# Patient Record
Sex: Male | Born: 1940 | Race: White | Hispanic: No | Marital: Married | Smoking: Former smoker
Health system: Southern US, Community
[De-identification: ages and names within clinical notes are randomized; demographics above are authoritative.]

## PROBLEM LIST (undated history)

## (undated) DIAGNOSIS — Z9189 Other specified personal risk factors, not elsewhere classified: Secondary | ICD-10-CM

## (undated) DIAGNOSIS — Z972 Presence of dental prosthetic device (complete) (partial): Secondary | ICD-10-CM

## (undated) DIAGNOSIS — Z951 Presence of aortocoronary bypass graft: Secondary | ICD-10-CM

## (undated) DIAGNOSIS — I10 Essential (primary) hypertension: Secondary | ICD-10-CM

## (undated) DIAGNOSIS — E785 Hyperlipidemia, unspecified: Secondary | ICD-10-CM

## (undated) DIAGNOSIS — I252 Old myocardial infarction: Secondary | ICD-10-CM

## (undated) DIAGNOSIS — Z973 Presence of spectacles and contact lenses: Secondary | ICD-10-CM

## (undated) DIAGNOSIS — K219 Gastro-esophageal reflux disease without esophagitis: Secondary | ICD-10-CM

## (undated) DIAGNOSIS — R351 Nocturia: Secondary | ICD-10-CM

## (undated) DIAGNOSIS — E119 Type 2 diabetes mellitus without complications: Secondary | ICD-10-CM

## (undated) DIAGNOSIS — J302 Other seasonal allergic rhinitis: Secondary | ICD-10-CM

## (undated) DIAGNOSIS — G5793 Unspecified mononeuropathy of bilateral lower limbs: Secondary | ICD-10-CM

## (undated) DIAGNOSIS — I2510 Atherosclerotic heart disease of native coronary artery without angina pectoris: Secondary | ICD-10-CM

## (undated) DIAGNOSIS — M1990 Unspecified osteoarthritis, unspecified site: Secondary | ICD-10-CM

## (undated) DIAGNOSIS — C61 Malignant neoplasm of prostate: Secondary | ICD-10-CM

## (undated) HISTORY — PX: TONSILLECTOMY: SUR1361

## (undated) HISTORY — DX: Malignant neoplasm of prostate: C61

## (undated) HISTORY — PX: CARDIAC CATHETERIZATION: SHX172

## (undated) HISTORY — PX: CORONARY ARTERY BYPASS GRAFT: SHX141

---

## 2002-10-05 ENCOUNTER — Encounter: Payer: Self-pay | Admitting: Cardiothoracic Surgery

## 2002-10-09 ENCOUNTER — Encounter: Payer: Self-pay | Admitting: Cardiothoracic Surgery

## 2002-10-09 ENCOUNTER — Inpatient Hospital Stay (HOSPITAL_COMMUNITY): Admission: RE | Admit: 2002-10-09 | Discharge: 2002-10-14 | Payer: Self-pay | Admitting: Cardiothoracic Surgery

## 2002-10-10 ENCOUNTER — Encounter: Payer: Self-pay | Admitting: Cardiothoracic Surgery

## 2002-10-11 ENCOUNTER — Encounter: Payer: Self-pay | Admitting: Cardiothoracic Surgery

## 2002-10-12 ENCOUNTER — Encounter: Payer: Self-pay | Admitting: Cardiothoracic Surgery

## 2002-11-02 ENCOUNTER — Encounter: Payer: Self-pay | Admitting: Cardiothoracic Surgery

## 2002-11-02 ENCOUNTER — Encounter: Admission: RE | Admit: 2002-11-02 | Discharge: 2002-11-02 | Payer: Self-pay | Admitting: Cardiothoracic Surgery

## 2005-12-01 ENCOUNTER — Ambulatory Visit (HOSPITAL_COMMUNITY): Admission: RE | Admit: 2005-12-01 | Discharge: 2005-12-01 | Payer: Self-pay | Admitting: Orthopedic Surgery

## 2006-07-11 ENCOUNTER — Ambulatory Visit: Payer: Self-pay | Admitting: Gastroenterology

## 2010-07-20 ENCOUNTER — Ambulatory Visit: Payer: Self-pay | Admitting: Gastroenterology

## 2010-07-21 LAB — PATHOLOGY REPORT

## 2011-07-28 ENCOUNTER — Emergency Department: Payer: Self-pay

## 2014-03-25 DIAGNOSIS — C61 Malignant neoplasm of prostate: Secondary | ICD-10-CM

## 2014-03-25 HISTORY — DX: Malignant neoplasm of prostate: C61

## 2014-03-29 HISTORY — PX: PROSTATE BIOPSY: SHX241

## 2014-04-24 ENCOUNTER — Encounter: Payer: Self-pay | Admitting: *Deleted

## 2014-05-02 ENCOUNTER — Encounter: Payer: Self-pay | Admitting: Radiation Oncology

## 2014-05-02 ENCOUNTER — Ambulatory Visit
Admission: RE | Admit: 2014-05-02 | Discharge: 2014-05-02 | Disposition: A | Discharge status: Home / Self Care | Payer: PRIVATE HEALTH INSURANCE | Source: Ambulatory Visit | Attending: Radiation Oncology | Admitting: Radiation Oncology

## 2014-05-02 VITALS — BP 130/59 | HR 74 | Temp 97.8°F | Resp 20 | Ht 70.0 in | Wt 244.0 lb

## 2014-05-02 DIAGNOSIS — I252 Old myocardial infarction: Secondary | ICD-10-CM | POA: Insufficient documentation

## 2014-05-02 DIAGNOSIS — E7800 Pure hypercholesterolemia, unspecified: Secondary | ICD-10-CM | POA: Insufficient documentation

## 2014-05-02 DIAGNOSIS — C61 Malignant neoplasm of prostate: Secondary | ICD-10-CM | POA: Diagnosis not present

## 2014-05-02 DIAGNOSIS — Z87891 Personal history of nicotine dependence: Secondary | ICD-10-CM | POA: Diagnosis not present

## 2014-05-02 DIAGNOSIS — Z510 Encounter for antineoplastic radiation therapy: Secondary | ICD-10-CM | POA: Diagnosis not present

## 2014-05-02 DIAGNOSIS — E119 Type 2 diabetes mellitus without complications: Secondary | ICD-10-CM | POA: Insufficient documentation

## 2014-05-02 DIAGNOSIS — Z951 Presence of aortocoronary bypass graft: Secondary | ICD-10-CM | POA: Insufficient documentation

## 2014-05-02 DIAGNOSIS — N529 Male erectile dysfunction, unspecified: Secondary | ICD-10-CM | POA: Diagnosis not present

## 2014-05-02 DIAGNOSIS — I10 Essential (primary) hypertension: Secondary | ICD-10-CM | POA: Diagnosis not present

## 2014-05-02 DIAGNOSIS — Z7982 Long term (current) use of aspirin: Secondary | ICD-10-CM | POA: Diagnosis not present

## 2014-05-02 HISTORY — DX: Essential (primary) hypertension: I10

## 2014-05-02 NOTE — Progress Notes (Signed)
[facility] NEW PATIENT EVALUATION  Name: Melvin Johnson MRN: [id number]  Date:   05/02/2014           DOB: Apr 04, 1941  Status: outpatient   CC: Dr. Emily Filbert,  Risa Grill Camelia Eng, MD    REFERRING PHYSICIAN: Bernestine Amass, MD   DIAGNOSIS: Stage TI C. favorable risk adenocarcinoma prostate   HISTORY OF PRESENT ILLNESS:  Melvin Johnson is a 73 y.o. male who is seen today through the courtesy of Dr. Rana Snare for discussion of possible radiation therapy in the management of his stage TI C. favorable risk adenocarcinoma prostate. His PSA was under 1.0 until 2007. Since then he has had a steady rise. In June of 2014 his PSA was 3.1, rising to 4.3 by January of 2015. His last PSA was 5.2 on 03/04/2014. Percentage free PSA was only 16. He underwent ultrasound-guided biopsies by Dr. Risa Grill on 03/29/2014. He was found to have Gleason 6 (3+3) involving 5% of one core from right lateral mid gland, 20% of one core from the right lateral apex, 20% of one core from the right apex, 20% of one core from the left lateral base, 40% of one core from the left lateral mid gland and 25% of one core from left lateral apex. His gland volume was 34 cc. He is doing well from a GU and GI standpoint. His I PSS score is 1. He does have erectile dysfunction. He is interested in seed implantation.  PREVIOUS RADIATION THERAPY: No   PAST MEDICAL HISTORY:  has a past medical history of Diabetes mellitus without complication; Hypercholesterolemia; Hypertension; Prostate cancer (03/2014); Allergy; and Myocardial infarction (1999).     PAST SURGICAL HISTORY:  Past Surgical History  Procedure Laterality Date  . Cardiac surgery  2003    BYPASS x 4  . Prostate biopsy  03/29/14    Adenocarcinoma     FAMILY HISTORY: family history includes Cancer in his brother and mother; Heart attack in his father. His father died of heart attack at age 3. His mother died from lung  cancer.   SOCIAL HISTORY:  reports that he quit smoking about 16 years ago. His smoking use included Cigarettes. He has a 10 pack-year smoking history. He does not have any smokeless tobacco history on file. He reports that he does not drink alcohol or use illicit drugs.  Married, 3 children. 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   ALLERGIES: Iodine   MEDICATIONS:  Current Outpatient Prescriptions  Medication Sig Dispense Refill  . aspirin 81 MG tablet Take 81 mg by mouth daily.      Marland Kitchen atenolol (TENORMIN) 50 MG tablet Take 50 mg by mouth daily.      Marland Kitchen glipiZIDE (GLUCOTROL) 5 MG tablet Take 5 mg by mouth.      . hydrochlorothiazide (HYDRODIURIL) 25 MG tablet Take 25 mg by mouth daily.      Marland Kitchen lisinopril (PRINIVIL,ZESTRIL) 20 MG tablet Take 20 mg by mouth daily.      . Multiple Vitamin (MULTIVITAMIN) tablet Take 1 tablet by mouth daily.      . sildenafil (REVATIO) 20 MG tablet Take 20 mg by mouth as needed.      . simvastatin (ZOCOR) 40 MG tablet Take 40 mg by mouth at bedtime.       No current facility-administered medications for this encounter.     REVIEW OF  SYSTEMS:  Pertinent items are noted in HPI.    PHYSICAL EXAM:  height is 5\' 10"  (1.778 m) and weight is 244 lb (110.678 kg). His oral temperature is 97.8 F (36.6 C). His blood pressure is 130/59 and his pulse is 74. His respiration is 20.   Alert and oriented 73 year old white male appearing his stated age. Head and neck examination: Grossly unremarkable. Nodes: Without palpable cervical or supraclavicular adenopathy. Chest: Lungs clear. Abdomen: Without masses organomegaly. Genitalia: Unremarkable to inspection. Rectal: Prostate gland is normal size and is without focal induration or nodularity. Extremities: Without edema.   LABORATORY DATA:  No results found for this basename: WBC, HGB, HCT, MCV, PLT   No results found for this basename: NA, K, CL, CO2    No results found for this basename: ALT, AST, GGT, ALKPHOS, BILITOT   PSA 5.2 from 03/04/2014   IMPRESSION: Stage TI C. favorable risk adenocarcinoma prostate. I explained to the patient and his wife that his prognosis is related to his stage, PSA level, and Gleason score. All are favorable. Other prognostic factors include PSA doubling time and disease volume, and these are also favorable. We discussed close observation/surveillance and also radiation therapy options including seed implantation alone or 8 weeks of external beam/IMRT. He would be a good candidate for this observation/surveillance or prostate seed implantation. We discussed the fact that he is probably not likely to died from prostate cancer. Since he does appear to have a 10 year life expectancy, I would offer him potentially curative ration therapy with seed implantation. We discussed the potential acute and late toxicities of radiation therapy. We discussed radiation safety concerns as well. If he wants seed implantation then I would like to obtain a CT arch study. He was given literature for review. He'll think things over and get back in touch with me if he wants to consider seed implantation. All of his questions were answered.   PLAN: As discussed above.  I spent 60 minutes minutes face to face with the patient and more than 50% of that time was spent in counseling and/or coordination of care.

## 2014-05-02 NOTE — Progress Notes (Signed)
GU Location of Tumor / Histology: PROSTATE  If Prostate Cancer, Gleason Score is ( 3 +3=6 ) and PSA  ( 5.20) 03/04/14 VOLUME= 34CC 03/29/14     Eudelia Bunch presented  months ago with signs/symptoms of: increase in PSA'S,  (6/12) cores January 2015 PSA=-4.3 Aoril 2015=4.95 June 2014 PSA=3.1 2007 PSA=1.0  Biopsies of  Prostate  revealed:03/29/14 : Adenocarcinoma   Past/Anticipated interventions by urology, if any: 04/18/14 appt Rana Snare, seed implant consideration  Past/Anticipated interventions by medical oncology, if any: No  Weight changes, if any: no  Bowel/Bladder complaints, if any: minimal voiding complaints, nocturia x 1 , ED,  IPSS 1  Nausea/Vomiting, if any: No  Pain issues, if any:  Back pain due to yard work recently  SAFETY ISSUES:  Prior radiation? NO  Pacemaker/ICD? NO  Is the patient on methotrexate? No  Current Complaints / other details: Married,   3 children,  Hx MI with Heart Surgery (Bypass 2003), DM, HTN, former cigarette smoker Mother Lung cancer(74), Father MI,(79) both deceased, Brother lung cancer Dr Risa Grill: Patient is ideal candidate for seed implant.

## 2014-05-02 NOTE — Progress Notes (Signed)
Please see the Nurse Progress Note in the MD Initial Consult Encounter for this patient. 

## 2014-05-07 ENCOUNTER — Telehealth: Payer: Self-pay | Admitting: *Deleted

## 2014-05-07 ENCOUNTER — Encounter: Payer: Self-pay | Admitting: Radiation Oncology

## 2014-05-07 NOTE — Progress Notes (Signed)
CC: Dr. Rana Snare   Chart note: The patient wants to proceed with a seed implant. I will have him return for a CT arch study, and then get him scheduled for a seed implant with Dr. Risa Grill.

## 2014-05-07 NOTE — Telephone Encounter (Signed)
CALLED PATIENT TO INFORM PRE-SEED APPT. FOR 05-09-14 @ 3 PM, SPOKE WITH PATIENT AND HE IS AWARE OF THIS APPT.

## 2014-05-08 ENCOUNTER — Telehealth: Payer: Self-pay | Admitting: *Deleted

## 2014-05-08 NOTE — Telephone Encounter (Signed)
Called patient to remind of appt. For 05-09-14 @ 3 pm, spoke with patient and he is aware of this appt.

## 2014-05-09 ENCOUNTER — Ambulatory Visit
Admission: RE | Admit: 2014-05-09 | Discharge: 2014-05-09 | Disposition: A | Discharge status: Home / Self Care | Payer: PRIVATE HEALTH INSURANCE | Source: Ambulatory Visit | Attending: Radiation Oncology | Admitting: Radiation Oncology

## 2014-05-09 DIAGNOSIS — Z510 Encounter for antineoplastic radiation therapy: Secondary | ICD-10-CM | POA: Diagnosis not present

## 2014-05-09 DIAGNOSIS — C61 Malignant neoplasm of prostate: Secondary | ICD-10-CM

## 2014-05-09 NOTE — Progress Notes (Signed)
CC: Dr. Rana Snare      Complex simulation/treatment planning note: The patient was taken to the CT simulator. His pelvis was scanned. The CT data set was sent to the planning system were I contoured his prostate and this was projected over the pubic arch. His prostate volume is 30.8 cc. His arch is open. He is a candidate for seed implantation. I am prescribing 14,500 cGy utilizing I-125 seeds with the [employer] system.

## 2014-05-10 ENCOUNTER — Other Ambulatory Visit: Payer: Self-pay | Admitting: Urology

## 2014-05-10 ENCOUNTER — Telehealth: Payer: Self-pay | Admitting: *Deleted

## 2014-05-10 NOTE — Telephone Encounter (Signed)
CALLED PATIENT TO INFORM OF IMPLANT DATE, SPOKE WITH PATIENT AND HE IS AWARE OF THIS DATE 

## 2014-05-13 ENCOUNTER — Ambulatory Visit (HOSPITAL_BASED_OUTPATIENT_CLINIC_OR_DEPARTMENT_OTHER)
Admission: RE | Admit: 2014-05-13 | Discharge: 2014-05-13 | Disposition: A | Discharge status: Home / Self Care | Payer: No Typology Code available for payment source | Source: Ambulatory Visit | Attending: Urology | Admitting: Urology

## 2014-05-13 ENCOUNTER — Encounter (HOSPITAL_BASED_OUTPATIENT_CLINIC_OR_DEPARTMENT_OTHER)
Admission: RE | Admit: 2014-05-13 | Discharge: 2014-05-13 | Disposition: A | Discharge status: Home / Self Care | Payer: No Typology Code available for payment source | Source: Ambulatory Visit | Attending: Urology | Admitting: Urology

## 2014-05-13 DIAGNOSIS — Z01818 Encounter for other preprocedural examination: Secondary | ICD-10-CM | POA: Diagnosis not present

## 2014-06-25 ENCOUNTER — Telehealth: Payer: Self-pay | Admitting: *Deleted

## 2014-06-25 ENCOUNTER — Encounter (HOSPITAL_BASED_OUTPATIENT_CLINIC_OR_DEPARTMENT_OTHER): Payer: Self-pay | Admitting: *Deleted

## 2014-06-25 NOTE — Telephone Encounter (Signed)
CALLED PATIENT TO REMIND OF LAB FOR 06-26-14, SPOKE WITH PATIENT AND HE IS AWARE OF THIS APPT.

## 2014-06-26 ENCOUNTER — Encounter (HOSPITAL_BASED_OUTPATIENT_CLINIC_OR_DEPARTMENT_OTHER): Payer: Self-pay | Admitting: *Deleted

## 2014-06-26 DIAGNOSIS — Z87891 Personal history of nicotine dependence: Secondary | ICD-10-CM | POA: Diagnosis not present

## 2014-06-26 DIAGNOSIS — I10 Essential (primary) hypertension: Secondary | ICD-10-CM | POA: Diagnosis not present

## 2014-06-26 DIAGNOSIS — E119 Type 2 diabetes mellitus without complications: Secondary | ICD-10-CM | POA: Diagnosis not present

## 2014-06-26 DIAGNOSIS — K219 Gastro-esophageal reflux disease without esophagitis: Secondary | ICD-10-CM | POA: Diagnosis not present

## 2014-06-26 DIAGNOSIS — Z79899 Other long term (current) drug therapy: Secondary | ICD-10-CM | POA: Diagnosis not present

## 2014-06-26 DIAGNOSIS — I2510 Atherosclerotic heart disease of native coronary artery without angina pectoris: Secondary | ICD-10-CM | POA: Diagnosis not present

## 2014-06-26 DIAGNOSIS — Z7982 Long term (current) use of aspirin: Secondary | ICD-10-CM | POA: Diagnosis not present

## 2014-06-26 DIAGNOSIS — I252 Old myocardial infarction: Secondary | ICD-10-CM | POA: Diagnosis not present

## 2014-06-26 DIAGNOSIS — E7800 Pure hypercholesterolemia, unspecified: Secondary | ICD-10-CM | POA: Diagnosis not present

## 2014-06-26 DIAGNOSIS — Z951 Presence of aortocoronary bypass graft: Secondary | ICD-10-CM | POA: Diagnosis not present

## 2014-06-26 DIAGNOSIS — C61 Malignant neoplasm of prostate: Secondary | ICD-10-CM | POA: Diagnosis not present

## 2014-06-26 LAB — COMPREHENSIVE METABOLIC PANEL
ALT: 15 U/L (ref 0–53)
ANION GAP: 11 (ref 5–15)
AST: 17 U/L (ref 0–37)
Albumin: 3.7 g/dL (ref 3.5–5.2)
Alkaline Phosphatase: 54 U/L (ref 39–117)
BILIRUBIN TOTAL: 0.3 mg/dL (ref 0.3–1.2)
BUN: 23 mg/dL (ref 6–23)
CHLORIDE: 97 meq/L (ref 96–112)
CO2: 30 mEq/L (ref 19–32)
CREATININE: 1.48 mg/dL — AB (ref 0.50–1.35)
Calcium: 9.2 mg/dL (ref 8.4–10.5)
GFR, EST AFRICAN AMERICAN: 52 mL/min — AB (ref >90)
GFR, EST NON AFRICAN AMERICAN: 45 mL/min — AB (ref >90)
GLUCOSE: 345 mg/dL — AB (ref 70–99)
Potassium: 4.2 mEq/L (ref 3.7–5.3)
Sodium: 138 mEq/L (ref 137–147)
Total Protein: 7.1 g/dL (ref 6.0–8.3)

## 2014-06-26 LAB — CBC
HEMATOCRIT: 41.9 % (ref 39.0–52.0)
Hemoglobin: 13.9 g/dL (ref 13.0–17.0)
MCH: 31 pg (ref 26.0–34.0)
MCHC: 33.2 g/dL (ref 30.0–36.0)
MCV: 93.3 fL (ref 78.0–100.0)
PLATELETS: 157 10*3/uL (ref 150–400)
RBC: 4.49 MIL/uL (ref 4.22–5.81)
RDW: 13.6 % (ref 11.5–15.5)
WBC: 6 10*3/uL (ref 4.0–10.5)

## 2014-06-26 LAB — APTT: aPTT: 28 seconds (ref 24–37)

## 2014-06-26 LAB — PROTIME-INR
INR: 0.96 (ref 0.00–1.49)
Prothrombin Time: 12.8 seconds (ref 11.6–15.2)

## 2014-06-26 NOTE — Progress Notes (Signed)
06/26/14 1205  OBSTRUCTIVE SLEEP APNEA  Have you ever been diagnosed with sleep apnea through a sleep study? No  Do you snore loudly (loud enough to be heard through closed doors)?  0  Do you often feel tired, fatigued, or sleepy during the daytime? 0  Has anyone observed you stop breathing during your sleep? 0  Do you have, or are you being treated for high blood pressure? 1  BMI more than 35 kg/m2? 0  Age over 74 years old? 1  Neck circumference greater than 40 cm/16 inches? 1  Gender: 1  Obstructive Sleep Apnea Score 4  Score 4 or greater  Results sent to PCP

## 2014-06-26 NOTE — Progress Notes (Signed)
NPO AFTER MN. ARRIVE AT 0700. CURRENT LAB RESULTS , CXR, AND EKG IN CHART AND EPIC. WILL TAKE ATENOLOL AM DOS W/ SIPS OF WATER AND DO FLEET ENEMA.

## 2014-07-02 ENCOUNTER — Telehealth: Payer: Self-pay | Admitting: *Deleted

## 2014-07-02 DIAGNOSIS — E119 Type 2 diabetes mellitus without complications: Secondary | ICD-10-CM | POA: Diagnosis not present

## 2014-07-02 DIAGNOSIS — I252 Old myocardial infarction: Secondary | ICD-10-CM | POA: Diagnosis not present

## 2014-07-02 DIAGNOSIS — C61 Malignant neoplasm of prostate: Secondary | ICD-10-CM | POA: Diagnosis not present

## 2014-07-02 DIAGNOSIS — Z7982 Long term (current) use of aspirin: Secondary | ICD-10-CM | POA: Diagnosis not present

## 2014-07-02 DIAGNOSIS — Z510 Encounter for antineoplastic radiation therapy: Secondary | ICD-10-CM | POA: Diagnosis not present

## 2014-07-02 DIAGNOSIS — N529 Male erectile dysfunction, unspecified: Secondary | ICD-10-CM | POA: Diagnosis not present

## 2014-07-02 DIAGNOSIS — I10 Essential (primary) hypertension: Secondary | ICD-10-CM | POA: Diagnosis not present

## 2014-07-02 DIAGNOSIS — E7800 Pure hypercholesterolemia, unspecified: Secondary | ICD-10-CM | POA: Diagnosis not present

## 2014-07-02 DIAGNOSIS — Z951 Presence of aortocoronary bypass graft: Secondary | ICD-10-CM | POA: Diagnosis not present

## 2014-07-02 DIAGNOSIS — Z87891 Personal history of nicotine dependence: Secondary | ICD-10-CM | POA: Diagnosis not present

## 2014-07-02 NOTE — Anesthesia Preprocedure Evaluation (Addendum)
Anesthesia Evaluation  Patient identified by MRN, date of birth, ID band Patient awake    Reviewed: Allergy & Precautions, H&P , NPO status , Patient's Chart, lab work & pertinent test results  Airway Mallampati: III TM Distance: >3 FB Neck ROM: full    Dental  (+) Edentulous Upper, Dental Advisory Given, Caps All caps lower:   Pulmonary neg pulmonary ROS, former smoker,  Stop bang 4 breath sounds clear to auscultation  Pulmonary exam normal       Cardiovascular Exercise Tolerance: Good hypertension, Pt. on medications and Pt. on home beta blockers + CAD, + Past MI and + CABG Rhythm:regular Rate:Normal     Neuro/Psych neuropathy negative neurological ROS  negative psych ROS   GI/Hepatic Neg liver ROS, GERD-  Medicated and Controlled,  Endo/Other  diabetes, Well Controlled, Type 2, Oral Hypoglycemic Agents  Renal/GU negative Renal ROS  negative genitourinary   Musculoskeletal   Abdominal   Peds  Hematology negative hematology ROS (+)   Anesthesia Other Findings   Reproductive/Obstetrics negative OB ROS                       Anesthesia Physical Anesthesia Plan  ASA: III  Anesthesia Plan: General   Post-op Pain Management:    Induction: Intravenous  Airway Management Planned: Oral ETT  Additional Equipment:   Intra-op Plan:   Post-operative Plan: Extubation in OR  Informed Consent: I have reviewed the patients History and Physical, chart, labs and discussed the procedure including the risks, benefits and alternatives for the proposed anesthesia with the patient or authorized representative who has indicated his/her understanding and acceptance.   Dental Advisory Given  Plan Discussed with: CRNA and Surgeon  Anesthesia Plan Comments:        Anesthesia Quick Evaluation

## 2014-07-02 NOTE — H&P (Signed)
Reason for visit  Prostate seed implantation    History of Present Illness   Melvin Johnson presented in May of 2015 as a referral from Dr Emily Filbert for an elevated PSA. Melvin Johnson is currently 73 years of age. He really has no significant prior urologic history. He has been getting routine PSA testing for at least the last 10-15 years. PSA was under 1.0 until 2007. Since that time, there has been a slow but steady increase in his PSA. In June of 2014, PSA was up to 3.1. In January of 2015, PSA was 4.3. It was repeated 3 months later and was up to 4.95. He has had little in the way of voiding complaints. He has no family history of prostate cancer. The patient does have non-insulin requiring diabetes mellitus and coronary artery disease status post bypass surgery in 2003.     IPSS= 3    [id number]    Digital rectal exam showed nothing of concern patient subsequently underwent ultrasound and biopsy of the prostate on March 29, 2014.    Prostate volume was 34 g. No worrisome ultrasound findings were appreciated. The patient and developed having 6/12 biopsy cores positive for adenocarcinoma. 3 on the left and 3 on the right. All positive cores were Gleason's 3+3 equal 6. Core involvement was 5-40%. Patient is best classified as low risk clinical stage TIc   Past Medical History Problems  1. History of diabetes mellitus (V12.29) 2. History of hypercholesterolemia (V12.29) 3. History of hypertension (V12.59) 4. History of myocardial infarction (412)  Surgical History Problems  1. History of Heart Surgery  Current Meds 1. Adult Aspirin Low Strength 81 MG TBDP;  Therapy: (Recorded:11May2015) to Recorded 2. Atenolol 50 MG Oral Tablet;  Therapy: (Recorded:11May2015) to Recorded 3. Etodolac 500 MG Oral Tablet;  Therapy: (Recorded:11May2015) to Recorded 4. GlipiZIDE 5 MG Oral Tablet;  Therapy: (Recorded:11May2015) to Recorded 5. Hydrochlorothiazide 25 MG Oral Tablet;  Therapy:  (Recorded:11May2015) to Recorded 6. Lisinopril 20 MG Oral Tablet;  Therapy: (Recorded:11May2015) to Recorded 7. Multi-Day TABS;  Therapy: (Recorded:11May2015) to Recorded 8. Sildenafil Citrate 20 MG Oral Tablet;  Therapy: (Recorded:11May2015) to Recorded 9. Simvastatin 40 MG Oral Tablet;  Therapy: (Recorded:11May2015) to Recorded 10. Stool Softener CAPS;   Therapy: (Recorded:11May2015) to Recorded 11. Victoza SOLN;   Therapy: (Recorded:11May2015) to Recorded  Allergies Medication  1. Iodine SOLN  Family History Problems  1. Family history of lung cancer (V16.1) : Mother, Brother 2. Family history of myocardial infarction (V17.3) : Father  Social History Problems  1. Denied: History of Alcohol use 2. Denied: History of Caffeine use 3. Father deceased   17yrs, MI 62. Former smoker (V15.82) 5. Married 6. Mother deceased   20yrs, Cancer 7. Retired 64. Three children  Review of Systems Genitourinary, constitutional, skin, eye, otolaryngeal, hematologic/lymphatic, cardiovascular, pulmonary, endocrine, musculoskeletal, gastrointestinal, neurological and psychiatric system(s) were reviewed and pertinent findings if present are noted.  Genitourinary: nocturia and erectile dysfunction.  Gastrointestinal: diarrhea.   Physical Exam Constitutional: Well nourished and well developed . No acute distress.  ENT:. The ears and nose are normal in appearance.  Neck: The appearance of the neck is normal and no neck mass is present.  Pulmonary: No respiratory distress and normal respiratory rhythm and effort.  Cardiovascular: Heart rate and rhythm are normal . No peripheral edema.  Abdomen: The abdomen is obese. The abdomen is soft and nontender. No masses are palpated. No CVA tenderness. No hernias are palpable. No hepatosplenomegaly noted.  Rectal: Rectal exam  demonstrates normal sphincter tone, no tenderness and no masses. Estimated prostate size is 2+. Normal rectal tone, no rectal  masses, prostate is smooth, symmetric and non-tender. The prostate has no nodularity and is not tender. The left seminal vesicle is nonpalpable. The right seminal vesicle is nonpalpable. The perineum is normal on inspection.  Genitourinary: Examination of the penis demonstrates no discharge, no masses, no lesions and a normal meatus. The scrotum is without lesions. The right epididymis is palpably normal and non-tender. The left epididymis is palpably normal and non-tender. The right testis is non-tender and without masses. The left testis is non-tender and without masses.  Lymphatics: The femoral and inguinal nodes are not enlarged or tender.  Skin: Normal skin turgor, no visible rash and no visible skin lesions.  Neuro/Psych:. Mood and affect are appropriate.    Assessment Assessed  1. Prostate cancer (185)  Plan Prostate cancer  1. Radiation Oncology Referral Referral  Referral  Status: Hold For - Appointment,Records   Requested for: 25Jun2015  Discussion/Summary  The patient was counseled about the natural history of prostate cancer and the standard treatment options that are available for prostate cancer. It was explained to him how his age and life expectancy, clinical stage, Gleason score, and PSA affect his prognosis, the decision to proceed with additional staging studies, as well as how that information influences recommended treatment strategies. We discussed the roles for active surveillance, radiation therapy, surgical therapy, androgen deprivation, as well as ablative therapy options for the treatment of prostate cancer as appropriate to his individual cancer situation. We discussed the risks and benefits of these options with regard to their impact on cancer control and also in terms of potential adverse events, complications, and impact on quiality of life particularly related to urinary, bowel, and sexual function. The patient was encouraged to ask questions throughout the  discussion today and all questions were answered to his stated satisfaction. In addition, the patient was provided with and/or directed to appropriate resources and literature for further education about prostate cancer and treatment options.    AmendmentMister Johnson has a favorable risk clinical stage TIc prostate cancer. In my opinion given his current cancer situation, age and medical comorbidities a radical prostatectomy would be considered overaggressive therapy. He is potentially a candidate for active surveillance but I do have concerns that he has a larger volume cancer. I believe he is an ideal candidate for seed implantation. He has a PSA well under 10 Gleason 6 disease and a small prostate with minimal voiding complaints. I will set him up to see Dr. Arloa Koh to discuss the option of seed implantation.

## 2014-07-02 NOTE — Telephone Encounter (Signed)
CALLED PATIENT TO REMIND OF PROCEDURE FOR 07-03-14, SPOKE WITH PATIENT AND HE IS AWARE OF THIS PROCEDURE

## 2014-07-03 ENCOUNTER — Encounter (HOSPITAL_BASED_OUTPATIENT_CLINIC_OR_DEPARTMENT_OTHER): Payer: PRIVATE HEALTH INSURANCE | Admitting: Anesthesiology

## 2014-07-03 ENCOUNTER — Encounter: Payer: Self-pay | Admitting: Radiation Oncology

## 2014-07-03 ENCOUNTER — Ambulatory Visit (HOSPITAL_BASED_OUTPATIENT_CLINIC_OR_DEPARTMENT_OTHER): Payer: PRIVATE HEALTH INSURANCE | Admitting: Anesthesiology

## 2014-07-03 ENCOUNTER — Encounter (HOSPITAL_BASED_OUTPATIENT_CLINIC_OR_DEPARTMENT_OTHER): Payer: Self-pay

## 2014-07-03 ENCOUNTER — Encounter (HOSPITAL_BASED_OUTPATIENT_CLINIC_OR_DEPARTMENT_OTHER)
Admission: RE | Disposition: A | Discharge status: Home / Self Care | Payer: Self-pay | Source: Ambulatory Visit | Attending: Urology

## 2014-07-03 ENCOUNTER — Ambulatory Visit (HOSPITAL_BASED_OUTPATIENT_CLINIC_OR_DEPARTMENT_OTHER)
Admission: RE | Admit: 2014-07-03 | Discharge: 2014-07-03 | Disposition: A | Discharge status: Home / Self Care | Payer: PRIVATE HEALTH INSURANCE | Source: Ambulatory Visit | Attending: Urology | Admitting: Urology

## 2014-07-03 ENCOUNTER — Ambulatory Visit (HOSPITAL_COMMUNITY): Payer: PRIVATE HEALTH INSURANCE

## 2014-07-03 DIAGNOSIS — E7800 Pure hypercholesterolemia, unspecified: Secondary | ICD-10-CM | POA: Insufficient documentation

## 2014-07-03 DIAGNOSIS — I2510 Atherosclerotic heart disease of native coronary artery without angina pectoris: Secondary | ICD-10-CM | POA: Diagnosis not present

## 2014-07-03 DIAGNOSIS — Z510 Encounter for antineoplastic radiation therapy: Secondary | ICD-10-CM | POA: Diagnosis not present

## 2014-07-03 DIAGNOSIS — C61 Malignant neoplasm of prostate: Secondary | ICD-10-CM | POA: Insufficient documentation

## 2014-07-03 DIAGNOSIS — Z7982 Long term (current) use of aspirin: Secondary | ICD-10-CM | POA: Insufficient documentation

## 2014-07-03 DIAGNOSIS — Z79899 Other long term (current) drug therapy: Secondary | ICD-10-CM | POA: Insufficient documentation

## 2014-07-03 DIAGNOSIS — E119 Type 2 diabetes mellitus without complications: Secondary | ICD-10-CM | POA: Insufficient documentation

## 2014-07-03 DIAGNOSIS — I10 Essential (primary) hypertension: Secondary | ICD-10-CM | POA: Diagnosis not present

## 2014-07-03 DIAGNOSIS — K219 Gastro-esophageal reflux disease without esophagitis: Secondary | ICD-10-CM | POA: Insufficient documentation

## 2014-07-03 DIAGNOSIS — Z87891 Personal history of nicotine dependence: Secondary | ICD-10-CM | POA: Insufficient documentation

## 2014-07-03 DIAGNOSIS — Z951 Presence of aortocoronary bypass graft: Secondary | ICD-10-CM | POA: Insufficient documentation

## 2014-07-03 DIAGNOSIS — I252 Old myocardial infarction: Secondary | ICD-10-CM | POA: Insufficient documentation

## 2014-07-03 HISTORY — DX: Unspecified mononeuropathy of bilateral lower limbs: G57.93

## 2014-07-03 HISTORY — DX: Presence of dental prosthetic device (complete) (partial): Z97.2

## 2014-07-03 HISTORY — DX: Type 2 diabetes mellitus without complications: E11.9

## 2014-07-03 HISTORY — PX: RADIOACTIVE SEED IMPLANT: SHX5150

## 2014-07-03 HISTORY — DX: Presence of aortocoronary bypass graft: Z95.1

## 2014-07-03 HISTORY — DX: Gastro-esophageal reflux disease without esophagitis: K21.9

## 2014-07-03 HISTORY — DX: Atherosclerotic heart disease of native coronary artery without angina pectoris: I25.10

## 2014-07-03 HISTORY — DX: Old myocardial infarction: I25.2

## 2014-07-03 HISTORY — DX: Presence of spectacles and contact lenses: Z97.3

## 2014-07-03 HISTORY — DX: Nocturia: R35.1

## 2014-07-03 HISTORY — DX: Hyperlipidemia, unspecified: E78.5

## 2014-07-03 HISTORY — DX: Other seasonal allergic rhinitis: J30.2

## 2014-07-03 HISTORY — DX: Other specified personal risk factors, not elsewhere classified: Z91.89

## 2014-07-03 LAB — GLUCOSE, CAPILLARY
GLUCOSE-CAPILLARY: 238 mg/dL — AB (ref 70–99)
Glucose-Capillary: 298 mg/dL — ABNORMAL HIGH (ref 70–99)

## 2014-07-03 SURGERY — INSERTION, RADIATION SOURCE, PROSTATE
Anesthesia: General | Site: Prostate

## 2014-07-03 MED ORDER — HYDROCODONE-ACETAMINOPHEN 5-325 MG PO TABS: 1.0000 | ORAL_TABLET | Freq: Four times a day (QID) | ORAL | Status: AC | PRN

## 2014-07-03 MED ORDER — FLEET ENEMA 7-19 GM/118ML RE ENEM
1.0000 | ENEMA | Freq: Once | RECTAL | Status: DC
  Filled 2014-07-03: qty 1

## 2014-07-03 MED ORDER — LACTATED RINGERS IV SOLN
INTRAVENOUS | Status: DC
  Administered 2014-07-03 (×2): via INTRAVENOUS
  Filled 2014-07-03: qty 1000

## 2014-07-03 MED ORDER — HYDROCODONE-ACETAMINOPHEN 5-325 MG PO TABS
ORAL_TABLET | ORAL | Status: AC
  Filled 2014-07-03: qty 1

## 2014-07-03 MED ORDER — ACETAMINOPHEN 10 MG/ML IV SOLN
INTRAVENOUS | Status: DC | PRN
  Administered 2014-07-03: 1000 mg via INTRAVENOUS

## 2014-07-03 MED ORDER — GLYCOPYRROLATE 0.2 MG/ML IJ SOLN
INTRAMUSCULAR | Status: DC | PRN
  Administered 2014-07-03: .8 mg via INTRAVENOUS

## 2014-07-03 MED ORDER — METOCLOPRAMIDE HCL 5 MG/ML IJ SOLN
INTRAMUSCULAR | Status: DC | PRN
  Administered 2014-07-03: 10 mg via INTRAVENOUS

## 2014-07-03 MED ORDER — PROPOFOL 10 MG/ML IV BOLUS
INTRAVENOUS | Status: DC | PRN
  Administered 2014-07-03: 150 mg via INTRAVENOUS

## 2014-07-03 MED ORDER — ROCURONIUM BROMIDE 100 MG/10ML IV SOLN
INTRAVENOUS | Status: DC | PRN
  Administered 2014-07-03: 5 mg via INTRAVENOUS
  Administered 2014-07-03: 25 mg via INTRAVENOUS
  Administered 2014-07-03: 5 mg via INTRAVENOUS
  Administered 2014-07-03: 10 mg via INTRAVENOUS

## 2014-07-03 MED ORDER — DEXAMETHASONE SODIUM PHOSPHATE 4 MG/ML IJ SOLN
INTRAMUSCULAR | Status: DC | PRN
  Administered 2014-07-03: 8 mg via INTRAVENOUS

## 2014-07-03 MED ORDER — LIDOCAINE HCL 4 % MT SOLN
OROMUCOSAL | Status: DC | PRN
  Administered 2014-07-03: 2 mL via TOPICAL

## 2014-07-03 MED ORDER — FENTANYL CITRATE 0.05 MG/ML IJ SOLN
INTRAMUSCULAR | Status: AC
  Filled 2014-07-03: qty 6

## 2014-07-03 MED ORDER — HYDROCODONE-ACETAMINOPHEN 5-325 MG PO TABS
1.0000 | ORAL_TABLET | Freq: Four times a day (QID) | ORAL | Status: DC | PRN
  Administered 2014-07-03: 1 via ORAL
  Filled 2014-07-03: qty 2

## 2014-07-03 MED ORDER — CIPROFLOXACIN HCL 500 MG PO TABS: 500.0000 mg | ORAL_TABLET | Freq: Two times a day (BID) | ORAL | Status: DC

## 2014-07-03 MED ORDER — STERILE WATER FOR IRRIGATION IR SOLN
Status: DC | PRN
  Administered 2014-07-03: 3000 mL

## 2014-07-03 MED ORDER — ONDANSETRON HCL 4 MG/2ML IJ SOLN
INTRAMUSCULAR | Status: DC | PRN
  Administered 2014-07-03: 4 mg via INTRAVENOUS

## 2014-07-03 MED ORDER — IOHEXOL 350 MG/ML SOLN
INTRAVENOUS | Status: DC | PRN
  Administered 2014-07-03: 7 mL

## 2014-07-03 MED ORDER — MIDAZOLAM HCL 2 MG/2ML IJ SOLN
INTRAMUSCULAR | Status: AC
  Filled 2014-07-03: qty 2

## 2014-07-03 MED ORDER — BELLADONNA ALKALOIDS-OPIUM 16.2-60 MG RE SUPP
RECTAL | Status: AC
  Filled 2014-07-03: qty 1

## 2014-07-03 MED ORDER — LIDOCAINE HCL 2 % EX GEL
CUTANEOUS | Status: DC | PRN
  Administered 2014-07-03: 1 via URETHRAL

## 2014-07-03 MED ORDER — EPHEDRINE SULFATE 50 MG/ML IJ SOLN
INTRAMUSCULAR | Status: DC | PRN
  Administered 2014-07-03 (×4): 10 mg via INTRAVENOUS

## 2014-07-03 MED ORDER — MIDAZOLAM HCL 5 MG/5ML IJ SOLN
INTRAMUSCULAR | Status: DC | PRN
  Administered 2014-07-03: 2 mg via INTRAVENOUS

## 2014-07-03 MED ORDER — CIPROFLOXACIN IN D5W 400 MG/200ML IV SOLN
INTRAVENOUS | Status: AC
  Filled 2014-07-03: qty 200

## 2014-07-03 MED ORDER — CIPROFLOXACIN IN D5W 400 MG/200ML IV SOLN
400.0000 mg | INTRAVENOUS | Status: AC
  Administered 2014-07-03: 400 mg via INTRAVENOUS
  Filled 2014-07-03: qty 200

## 2014-07-03 MED ORDER — FENTANYL CITRATE 0.05 MG/ML IJ SOLN
25.0000 ug | INTRAMUSCULAR | Status: DC | PRN
  Filled 2014-07-03: qty 1

## 2014-07-03 MED ORDER — INSULIN ASPART 100 UNIT/ML ~~LOC~~ SOLN
0.0000 [IU] | SUBCUTANEOUS | Status: DC
  Administered 2014-07-03: 8 [IU] via SUBCUTANEOUS
  Filled 2014-07-03: qty 0.15

## 2014-07-03 MED ORDER — NEOSTIGMINE METHYLSULFATE 10 MG/10ML IV SOLN
INTRAVENOUS | Status: DC | PRN
  Administered 2014-07-03: 5 mg via INTRAVENOUS

## 2014-07-03 MED ORDER — BELLADONNA ALKALOIDS-OPIUM 16.2-60 MG RE SUPP
RECTAL | Status: DC | PRN
  Administered 2014-07-03: 1 via RECTAL

## 2014-07-03 MED ORDER — SUCCINYLCHOLINE CHLORIDE 20 MG/ML IJ SOLN
INTRAMUSCULAR | Status: DC | PRN
  Administered 2014-07-03: 120 mg via INTRAVENOUS

## 2014-07-03 MED ORDER — STERILE WATER FOR IRRIGATION IR SOLN
Status: DC | PRN
  Administered 2014-07-03: 3 mL

## 2014-07-03 MED ORDER — FENTANYL CITRATE 0.05 MG/ML IJ SOLN
INTRAMUSCULAR | Status: DC | PRN
  Administered 2014-07-03: 25 ug via INTRAVENOUS
  Administered 2014-07-03: 50 ug via INTRAVENOUS
  Administered 2014-07-03: 25 ug via INTRAVENOUS
  Administered 2014-07-03: 50 ug via INTRAVENOUS

## 2014-07-03 MED ORDER — LIDOCAINE HCL (CARDIAC) 20 MG/ML IV SOLN
INTRAVENOUS | Status: DC | PRN
  Administered 2014-07-03: 40 mg via INTRAVENOUS

## 2014-07-03 MED ORDER — LACTATED RINGERS IV SOLN
INTRAVENOUS | Status: DC
  Filled 2014-07-03: qty 1000

## 2014-07-03 SURGICAL SUPPLY — 24 items
BAG URINE DRAINAGE (UROLOGICAL SUPPLIES) ×3 IMPLANT
BLADE CLIPPER SURG (BLADE) ×3 IMPLANT
CATH FOLEY 2WAY SLVR  5CC 16FR (CATHETERS) ×4
CATH FOLEY 2WAY SLVR 5CC 16FR (CATHETERS) ×2 IMPLANT
CATH ROBINSON RED A/P 20FR (CATHETERS) ×5 IMPLANT
CLOTH BEACON ORANGE TIMEOUT ST (SAFETY) ×3 IMPLANT
COVER MAYO STAND STRL (DRAPES) ×3 IMPLANT
COVER TABLE BACK 60X90 (DRAPES) ×3 IMPLANT
DRSG TEGADERM 4X4.75 (GAUZE/BANDAGES/DRESSINGS) ×3 IMPLANT
DRSG TEGADERM 8X12 (GAUZE/BANDAGES/DRESSINGS) ×3 IMPLANT
GLOVE BIO SURGEON STRL SZ7 (GLOVE) ×2 IMPLANT
GLOVE BIO SURGEON STRL SZ7.5 (GLOVE) ×6 IMPLANT
GLOVE ECLIPSE 8.0 STRL XLNG CF (GLOVE) IMPLANT
GLOVE INDICATOR 7.5 STRL GRN (GLOVE) ×6 IMPLANT
GOWN STRL REIN XL XLG (GOWN DISPOSABLE) ×1 IMPLANT
GOWN STRL REUS W/ TWL XL LVL3 (GOWN DISPOSABLE) IMPLANT
GOWN STRL REUS W/TWL XL LVL3 (GOWN DISPOSABLE) ×6
HOLDER FOLEY CATH W/STRAP (MISCELLANEOUS) ×3 IMPLANT
PACK CYSTOSCOPY (CUSTOM PROCEDURE TRAY) ×3 IMPLANT
SPONGE GAUZE 4X4 12PLY STER LF (GAUZE/BANDAGES/DRESSINGS) ×2 IMPLANT
SYRINGE 10CC LL (SYRINGE) ×5 IMPLANT
UNDERPAD 30X30 INCONTINENT (UNDERPADS AND DIAPERS) ×6 IMPLANT
WATER STERILE IRR 500ML POUR (IV SOLUTION) ×3 IMPLANT
radioactive seed ×2 IMPLANT

## 2014-07-03 NOTE — Discharge Instructions (Addendum)
DISCHARGE INSTRUCTIONS FOR PROSTATE SEED IMPLANTATION ° °Removal of catheter °Remove the foley catheter after 24 hours ( day after the procedure).can be done easily by cutting the side port of the catheter, whichallow the balloon to deflate.  You will see 1-2 teaspoons of clear water as the balloon deflates and then the catheter can be slid out without difficulty. ° ° °     Cut here ° °Antibiotics °You may be given a prescription for an antibiotic to take when you arrive home. If so, be sure to take every tablet in the bottle, even if you are feeling better before the prescription is finished. If you begin itching, notice a rash or start to swell on your trunk, arms, legs and/or throat, immediately stop taking the antibiotic and call your Urologist. °Diet °Resume your usual diet when you return home. To keep your bowels moving easily and softly, drink prune, apple and cranberry juice at room temperature. You may also take a stool softener, such as Colace, which is available without prescription at local pharmacies. °Daily activities °  No driving or heavy lifting for at least two days after the implant. °  No bike riding, horseback riding or riding lawn mowers for the first month after the implant. °  Any strenuous physical activity should be approved by your doctor before you resume it. °Sexual relations °You may resume sexual relations two weeks after the procedure. A condom should be used for the first two weeks. Your semen may be dark brown or black; this is normal and is related bleeding that may have occurred during the implant. °Postoperative swelling °Expect swelling and bruising of the scrotum and perineum (the area between the scrotum and anus). Both the swelling and the bruising should resolve in l or 2 weeks. Ice packs and over- the-counter medications such as Tylenol, Advil or Aleve may lessen your discomfort. °Postoperative urination °Most men experience burning on urination and/or urinary frequency.  If this becomes bothersome, contact your Urologist.  Medication can be prescribed to relieve these problems.  It is normal to have some blood in your urine for a few days after the implant. °Special instructions related to the seeds °It is unlikely that you will pass an Iodine-125 seed in your urine. The seeds are silver in color and are about as large as a grain of rice. If you pass a seed, do not handle it with your fingers. Use a spoon to place it in an envelope or jar in place this in base occluded area such as the garage or basement for return to the radiation clinic at your convenience. ° °Contact your doctor for °  Temperature greater than 101 F °  Increasing pain °  Inability to urinate °Follow-up ° You should have follow up with your urologist and radiation oncologist about 3 weeks after the procedure. °General information regarding Iodine seeds °  Iodine-125 is a low energy radioactive material. It is not deeply penetrating and loses energy at short distances. Your prostate will absorb the radiation. Objects that are touched or used by the patient do not become radioactive. °  Body wastes (urine and stool) or body fluids (saliva, tears, semen or blood) are not radioactive. °  The Nuclear Regulatory Commission (NRC) has determined that no radiation precautions are needed for patients undergoing Iodine-125 seed implantation. The NRC states that such patients do not present a risk to the people around them, including young children and pregnant women. However, in keeping with the general principle   that radiation exposure should be kept as low reasonably possible, we suggest the following:   Children and pets should not sit on the patient's lap for the first two (2) weeks after the implant.   Pregnant (or possibly pregnant) women should avoid prolonged, close contact with the patient for the first two (2) weeks after the implant.   A distance of three (3) feet is acceptable.   At a distance of three (3)  feet, there is no limit to the length of time anyone can be with the patient.   May restart aspirin in 3 days  Post Anesthesia Home Care Instructions  Activity: Get plenty of rest for the remainder of the day. A responsible adult should stay with you for 24 hours following the procedure.  For the next 24 hours, DO NOT: -Drive a car -Paediatric nurse -Drink alcoholic beverages -Take any medication unless instructed by your physician -Make any legal decisions or sign important papers.  Meals: Start with liquid foods such as gelatin or soup. Progress to regular foods as tolerated. Avoid greasy, spicy, heavy foods. If nausea and/or vomiting occur, drink only clear liquids until the nausea and/or vomiting subsides. Call your physician if vomiting continues.  Special Instructions/Symptoms: Your throat may feel dry or sore from the anesthesia or the breathing tube placed in your throat during surgery. If this causes discomfort, gargle with warm salt water. The discomfort should disappear within 24 hours.

## 2014-07-03 NOTE — Op Note (Signed)
Preoperative diagnosis: Clinical stage TI C adenocarcinoma the prostate  Postoperative diagnosis: Same  Procedure: I-125 prostate seed implantation with Nucletron robotic implanter  Surgeon: Bernestine Amass M.D. , Arloa Koh, M.D.  Anesthesia: Gen.  Indications: Patient  was diagnosed with clinical stage TI C prostate cancer. We had extensive discussion with him about treatment options versus. He elected to proceed with seed implantation. He underwent consultation my office as well as with Dr. Arloa Koh. He appeared to understand the advantages disadvantages potential risks of this treatment option. Full informed consent has been obtained. The patient is had preoperative ciprofloxacin. PAS compression boots were placed.  Technique and findings: Patient was brought the operating room where he had successful induction of general anesthesia. He was placed in lithotomy position and prepped and draped in usual manner. Appropriate surgical timeout was performed. Radiation oncology department placed a transrectal ultrasound probe anchoring stand. Foley catheter with contrast in the balloon was inserted without difficulty. Anchoring needles were placed within the prostate. Real-time contouring of the urethra prostate and rectum were performed and the dosing parameters were established. Targeted dose was 145 gray. We then came to the operating suite suite for placement of the needles. A second timeout was performed. All needle passage was done with real-time transrectal ultrasound guidance with the sagittal plane. A total of 26 needles were placed. See implantation itself was done with the robotic implanter. 62 active seeds were implanted. A Foley catheter was removed and flexible cystoscopy failed to show any seeds outside the prostate. The Foley catheter was inserted which drained clear urine. The patient was brought to recovery room in stable condition.

## 2014-07-03 NOTE — Anesthesia Procedure Notes (Signed)
Procedure Name: Intubation Date/Time: 07/03/2014 8:45 AM Performed by: CALLAWAY, ROBIN G Pre-anesthesia Checklist: Patient identified, Emergency Drugs available, Suction available and Patient being monitored Patient Re-evaluated:Patient Re-evaluated prior to inductionOxygen Delivery Method: Circle System Utilized Preoxygenation: Pre-oxygenation with 100% oxygen Intubation Type: IV induction Ventilation: Mask ventilation without difficulty Laryngoscope Size: Mac and 4 Grade View: Grade I Tube type: Oral Tube size: 8.0 mm Number of attempts: 1 Airway Equipment and Method: stylet and LTA kit utilized Placement Confirmation: ETT inserted through vocal cords under direct vision,  positive ETCO2 and breath sounds checked- equal and bilateral Secured at: 22 cm Tube secured with: Tape Dental Injury: Teeth and Oropharynx as per pre-operative assessment      

## 2014-07-03 NOTE — Anesthesia Postprocedure Evaluation (Signed)
  Anesthesia Post-op Note  Patient: Melvin Johnson  Procedure(s) Performed: Procedure(s) (LRB): RADIOACTIVE SEED IMPLANT (N/A)  Patient Location: PACU  Anesthesia Type: General  Level of Consciousness: awake and alert   Airway and Oxygen Therapy: Patient Spontanous Breathing  Post-op Pain: mild  Post-op Assessment: Post-op Vital signs reviewed, Patient's Cardiovascular Status Stable, Respiratory Function Stable, Patent Airway and No signs of Nausea or vomiting  Last Vitals:  Filed Vitals:   07/03/14 1145  BP:   Pulse: 76  Temp:   Resp: 20    Post-op Vital Signs: stable   Complications: No apparent anesthesia complications

## 2014-07-03 NOTE — Progress Notes (Signed)
CC: Dr. Rana Snare  End of treatment summary  Diagnosis: Stage T1c favorable risk adenocarcinoma prostate  Intent: Curative  Implant date: 07/03/2014  Site/dose: Prostate 14,500 cGy  Isotope: I-125 implanting 62 seeds in 24 active needles. Individual seed activity 3.90 millicuries per seed for a total implant activity of 24.7 mCi.  Narrative: The patient appears to have undergone a successful Nucletron seed Selectron implant with Dr. Risa Grill.  Plan: Followup visit to see both of [place] in approximately 3 weeks. He will undergo a CT scan at that time to assess the quality of his implant.

## 2014-07-03 NOTE — Interval H&P Note (Signed)
History and Physical Interval Note:  07/03/2014 8:32 AM  Melvin Johnson  has presented today for surgery, with the diagnosis of PROSTATE CANCER  The various methods of treatment have been discussed with the patient and family. After consideration of risks, benefits and other options for treatment, the patient has consented to  Procedure(s): RADIOACTIVE SEED IMPLANT (N/A) as a surgical intervention .  The patient's history has been reviewed, patient examined, no change in status, stable for surgery.  I have reviewed the patient's chart and labs.  Questions were answered to the patient's satisfaction.     Lesley Galentine S

## 2014-07-03 NOTE — Progress Notes (Signed)
[facility] Operative Procedure Note  Name: Melvin Johnson MRN: [id number]  Date:   05/09/2014           DOB: [birth year]  Status:inpatient    CC: Dr. Rana Snare , Dr. Emily Filbert  DIAGNOSIS: A 74 year old gentlemen with stage T1c adenocarcinoma of the prostate with a Gleason of 6 and a PSA of 5.2.  PROCEDURE: Insertion of radioactive I-125 seeds into the prostate gland.  RADIATION DOSE: 145 Gy, definitive therapy.  TECHNIQUE: Melvin Johnson was brought to the operating room with Dr. Risa Grill. He was placed in the dorsolithotomy position. He was catheterized and a rectal tube was inserted. The perineum was shaved, prepped and draped. The ultrasound probe was then introduced into the rectum to see the prostate gland.  TREATMENT DEVICE: A needle grid was attached to the ultrasound probe stand and anchor needles were placed.  COMPLEX ISODOSE CALCULATION: The prostate was imaged in 3D using a sagittal sweep of the prostate probe. These images were transferred to the planning computer. There, the prostate, urethra and rectum were defined on each axial reconstructed image. Then, the software created an optimized plan and a few seed positions were adjusted. Then the accepted plan was uploaded to the seed Selectron afterloading unit.  SPECIAL TREATMENT PROCEDURE/SUPERVISION AND HANDLING: The Nucletron FIRST system was used to place the needles under sagittal guidance. A total of 24 needles were used to deposit 62 seeds in the prostate gland. The individual seed activity was 0.40 mCi for a total implant activity of 24.7 mCi.  COMPLEX SIMULATION: At the end of the procedure, an anterior radiograph of the pelvis was obtained to document seed positioning and count. Cystoscopy was performed to check the urethra and bladder.  MICRODOSIMETRY: At the end of the procedure, the patient was emitting 0.04 mrem/hr at 1 meter. Accordingly, he was considered safe  for hospital discharge.  PLAN: The patient will return to the radiation oncology clinic for post implant CT dosimetry in three weeks.

## 2014-07-03 NOTE — Transfer of Care (Signed)
Immediate Anesthesia Transfer of Care Note  Patient: Melvin Johnson  Procedure(s) Performed: Procedure(s) (LRB): RADIOACTIVE SEED IMPLANT (N/A)  Patient Location: PACU  Anesthesia Type: General  Level of Consciousness: sleepy  Airway & Oxygen Therapy: Patient Spontanous Breathing and Patient connected to face mask oxygen  Post-op Assessment: Report given to PACU RN and Post -op Vital signs reviewed and stable  Post vital signs: Reviewed and stable  Complications: No apparent anesthesia complications

## 2014-07-08 ENCOUNTER — Encounter (HOSPITAL_BASED_OUTPATIENT_CLINIC_OR_DEPARTMENT_OTHER): Payer: Self-pay | Admitting: Urology

## 2014-07-18 ENCOUNTER — Encounter: Payer: Self-pay | Admitting: *Deleted

## 2014-07-19 ENCOUNTER — Encounter: Payer: Self-pay | Admitting: *Deleted

## 2014-07-22 ENCOUNTER — Telehealth: Payer: Self-pay | Admitting: *Deleted

## 2014-07-22 ENCOUNTER — Telehealth: Payer: Self-pay

## 2014-07-22 NOTE — Telephone Encounter (Signed)
CALLED PATIENT TO REMIND OF APPTS. FOR 07-23-14, LVM FOR A RETURN CALL

## 2014-07-22 NOTE — Telephone Encounter (Signed)
Patient returned call to confirm appointment on 07/23/14.Arrive at 9:45 am to register for 10:00 am ct sim appointment and follow up with Dr.Murray after sim.

## 2014-07-23 ENCOUNTER — Encounter: Payer: Self-pay | Admitting: Radiation Oncology

## 2014-07-23 ENCOUNTER — Ambulatory Visit
Admission: RE | Admit: 2014-07-23 | Discharge: 2014-07-23 | Disposition: A | Discharge status: Home / Self Care | Payer: PRIVATE HEALTH INSURANCE | Source: Ambulatory Visit | Attending: Radiation Oncology | Admitting: Radiation Oncology

## 2014-07-23 ENCOUNTER — Ambulatory Visit
Admit: 2014-07-23 | Discharge: 2014-07-23 | Disposition: A | Discharge status: Home / Self Care | Payer: No Typology Code available for payment source | Attending: Radiation Oncology | Admitting: Radiation Oncology

## 2014-07-23 VITALS — BP 122/67 | HR 73 | Temp 98.2°F | Resp 20

## 2014-07-23 DIAGNOSIS — C61 Malignant neoplasm of prostate: Secondary | ICD-10-CM

## 2014-07-23 DIAGNOSIS — Z510 Encounter for antineoplastic radiation therapy: Secondary | ICD-10-CM | POA: Diagnosis not present

## 2014-07-23 NOTE — Progress Notes (Signed)
Complex simulation note: The patient was taken to the CT simulator. He was placed supine. His pelvis was scanned. The CT data set was sent to the planning system for contouring of his prostate and rectum. He will then undergo 3-D simulation to assess the quality of his implant.

## 2014-07-23 NOTE — Progress Notes (Signed)
CC: Dr. Rana Snare  Followup note:  Melvin Johnson returns today approximately 3 weeks following his prostate seed implant with Dr. Risa Grill in the management of his stage TI C. favorable risk adenocarcinoma prostate. He still doing well from a GU and GI standpoint although he does have slight increase in urinary frequency with occasional dribbling. He also has an occasional "sharp pains" radiating to his penis. This is not particular bothersome. He did have some erythema along his distal penis but this improved with zinc oxide. No GI difficulties. He will see Dr. Risa Grill this Friday.  His CT scan today for his post implant dosimetry shows an excellent seed distribution.  Physical examination: Alert and oriented. Filed Vitals:   07/23/14 1012  BP: 122/67  Pulse: 73  Temp: 98.2 F (36.8 C)  Resp: 20   Rectal examination not performed today.  Impression: Satisfactory progress with mild radiation urethritis as expected.  Plan: Follow up  with Dr. Risa Grill this Friday. I have not scheduled the patient for a formal followup visit and I ask that Dr. Risa Grill keep me posted on his progress. We will move ahead with his post implant dosimetry and forward the results Dr. Risa Grill in the near future.

## 2014-07-23 NOTE — Progress Notes (Addendum)
Patient denies pain, fatigue, loss of appetite. He states he had some constipation but no longer. He states he occasionally has a "quick sharp pain" in his groin area. He states he had some redness of his skin in groin area, used Desitin which resolved this issue. He also reports that he had dysuria, began taking Uribel, and "it got worse". He stopped taking Uribel.

## 2014-07-26 ENCOUNTER — Ambulatory Visit
Admission: RE | Admit: 2014-07-26 | Discharge: 2014-07-26 | Disposition: A | Discharge status: Home / Self Care | Payer: PRIVATE HEALTH INSURANCE | Source: Ambulatory Visit | Attending: Radiation Oncology | Admitting: Radiation Oncology

## 2014-07-26 ENCOUNTER — Encounter: Payer: Self-pay | Admitting: Radiation Oncology

## 2014-07-26 DIAGNOSIS — C61 Malignant neoplasm of prostate: Secondary | ICD-10-CM | POA: Insufficient documentation

## 2014-07-26 DIAGNOSIS — Z510 Encounter for antineoplastic radiation therapy: Secondary | ICD-10-CM | POA: Diagnosis not present

## 2014-07-26 NOTE — Progress Notes (Signed)
Dr. Rana Snare, Dr. Emily Filbert  Post implant CT dosimetry note:  The patient completed his post implant dosimetry to assess the quality of his prostate seed implant. His intraoperative prostate volume by ultrasound was 26.4 cc and his postoperative prostate volume by CT was 29.7 cc. Dose volume histograms were obtained for the prostate and rectum. His prostate D 90 is 114% and his V100 is 96.7%, both excellent.  0 cc of rectum received the prescribed dose of 14,500 cGy. In summary, Melvin Johnson has excellent post implant dosimetry with a low risk for late rectal toxicity.

## 2014-08-30 ENCOUNTER — Telehealth: Payer: Self-pay | Admitting: Dietician

## 2014-08-30 NOTE — Telephone Encounter (Signed)
NA

## 2014-12-16 ENCOUNTER — Ambulatory Visit: Payer: Self-pay | Admitting: Gastroenterology

## 2015-02-17 LAB — SURGICAL PATHOLOGY

## 2016-05-17 ENCOUNTER — Other Ambulatory Visit: Payer: Self-pay | Admitting: Internal Medicine

## 2016-05-17 DIAGNOSIS — M5116 Intervertebral disc disorders with radiculopathy, lumbar region: Secondary | ICD-10-CM

## 2016-05-28 ENCOUNTER — Ambulatory Visit
Admission: RE | Admit: 2016-05-28 | Discharge: 2016-05-28 | Disposition: A | Discharge status: Home / Self Care | Payer: Medicare HMO | Source: Ambulatory Visit | Attending: Internal Medicine | Admitting: Internal Medicine

## 2016-05-28 DIAGNOSIS — M5126 Other intervertebral disc displacement, lumbar region: Secondary | ICD-10-CM | POA: Insufficient documentation

## 2016-05-28 DIAGNOSIS — M5116 Intervertebral disc disorders with radiculopathy, lumbar region: Secondary | ICD-10-CM | POA: Insufficient documentation

## 2016-05-28 DIAGNOSIS — I714 Abdominal aortic aneurysm, without rupture: Secondary | ICD-10-CM | POA: Diagnosis not present

## 2020-05-09 ENCOUNTER — Other Ambulatory Visit: Payer: Self-pay | Admitting: Orthopedic Surgery

## 2020-05-27 ENCOUNTER — Other Ambulatory Visit: Payer: Self-pay | Admitting: Internal Medicine

## 2020-05-31 ENCOUNTER — Other Ambulatory Visit: Payer: Self-pay | Admitting: Internal Medicine

## 2020-05-31 DIAGNOSIS — I714 Abdominal aortic aneurysm, without rupture, unspecified: Secondary | ICD-10-CM

## 2020-06-06 ENCOUNTER — Other Ambulatory Visit: Payer: Self-pay

## 2020-06-06 ENCOUNTER — Other Ambulatory Visit: Payer: Self-pay | Admitting: Internal Medicine

## 2020-06-06 ENCOUNTER — Ambulatory Visit
Admission: RE | Admit: 2020-06-06 | Discharge: 2020-06-06 | Disposition: A | Discharge status: Home / Self Care | Payer: Medicare HMO | Source: Ambulatory Visit | Attending: Internal Medicine | Admitting: Internal Medicine

## 2020-06-06 DIAGNOSIS — I714 Abdominal aortic aneurysm, without rupture, unspecified: Secondary | ICD-10-CM

## 2021-02-19 ENCOUNTER — Encounter: Payer: Self-pay | Admitting: Ophthalmology

## 2021-02-26 NOTE — Discharge Instructions (Signed)

## 2021-02-27 NOTE — Anesthesia Preprocedure Evaluation (Addendum)
Anesthesia Evaluation  Patient identified by MRN, date of birth, ID band Patient awake    Reviewed: Allergy & Precautions, NPO status , Patient's Chart, lab work & pertinent test results  History of Anesthesia Complications Negative for: history of anesthetic complications  Airway Mallampati: IV   Neck ROM: Full    Dental  (+) Upper Dentures, Caps   Pulmonary former smoker (quit 1999),    Pulmonary exam normal breath sounds clear to auscultation       Cardiovascular hypertension, + CAD (s/p MI and CABG)  Normal cardiovascular exam Rhythm:Regular Rate:Normal     Neuro/Psych negative neurological ROS     GI/Hepatic GERD  ,  Endo/Other  diabetes, Type 2  Renal/GU Renal disease (stage III CKD)     Musculoskeletal  (+) Arthritis ,   Abdominal   Peds  Hematology Prostate CA   Anesthesia Other Findings   Reproductive/Obstetrics                            Anesthesia Physical Anesthesia Plan  ASA: III  Anesthesia Plan: MAC   Post-op Pain Management:    Induction: Intravenous  PONV Risk Score and Plan: 1 and TIVA, Midazolam and Treatment may vary due to age or medical condition  Airway Management Planned: Nasal Cannula  Additional Equipment:   Intra-op Plan:   Post-operative Plan:   Informed Consent: I have reviewed the patients History and Physical, chart, labs and discussed the procedure including the risks, benefits and alternatives for the proposed anesthesia with the patient or authorized representative who has indicated his/her understanding and acceptance.       Plan Discussed with: CRNA  Anesthesia Plan Comments:        Anesthesia Quick Evaluation

## 2021-03-02 ENCOUNTER — Encounter
Admission: RE | Disposition: A | Discharge status: Home / Self Care | Payer: Self-pay | Source: Ambulatory Visit | Attending: Ophthalmology

## 2021-03-02 ENCOUNTER — Encounter: Payer: Self-pay | Admitting: Ophthalmology

## 2021-03-02 ENCOUNTER — Other Ambulatory Visit: Payer: Self-pay

## 2021-03-02 ENCOUNTER — Ambulatory Visit: Payer: Medicare HMO | Admitting: Anesthesiology

## 2021-03-02 ENCOUNTER — Ambulatory Visit
Admission: RE | Admit: 2021-03-02 | Discharge: 2021-03-02 | Disposition: A | Discharge status: Home / Self Care | Payer: Medicare HMO | Source: Ambulatory Visit | Attending: Ophthalmology | Admitting: Ophthalmology

## 2021-03-02 DIAGNOSIS — Z91041 Radiographic dye allergy status: Secondary | ICD-10-CM | POA: Diagnosis not present

## 2021-03-02 DIAGNOSIS — E1140 Type 2 diabetes mellitus with diabetic neuropathy, unspecified: Secondary | ICD-10-CM | POA: Diagnosis not present

## 2021-03-02 DIAGNOSIS — Z951 Presence of aortocoronary bypass graft: Secondary | ICD-10-CM | POA: Insufficient documentation

## 2021-03-02 DIAGNOSIS — Z79899 Other long term (current) drug therapy: Secondary | ICD-10-CM | POA: Insufficient documentation

## 2021-03-02 DIAGNOSIS — H2511 Age-related nuclear cataract, right eye: Secondary | ICD-10-CM | POA: Diagnosis not present

## 2021-03-02 DIAGNOSIS — Z7982 Long term (current) use of aspirin: Secondary | ICD-10-CM | POA: Insufficient documentation

## 2021-03-02 DIAGNOSIS — Z8546 Personal history of malignant neoplasm of prostate: Secondary | ICD-10-CM | POA: Insufficient documentation

## 2021-03-02 DIAGNOSIS — Z87891 Personal history of nicotine dependence: Secondary | ICD-10-CM | POA: Insufficient documentation

## 2021-03-02 DIAGNOSIS — I129 Hypertensive chronic kidney disease with stage 1 through stage 4 chronic kidney disease, or unspecified chronic kidney disease: Secondary | ICD-10-CM | POA: Insufficient documentation

## 2021-03-02 DIAGNOSIS — E1136 Type 2 diabetes mellitus with diabetic cataract: Secondary | ICD-10-CM | POA: Insufficient documentation

## 2021-03-02 DIAGNOSIS — E1122 Type 2 diabetes mellitus with diabetic chronic kidney disease: Secondary | ICD-10-CM | POA: Diagnosis not present

## 2021-03-02 DIAGNOSIS — N183 Chronic kidney disease, stage 3 unspecified: Secondary | ICD-10-CM | POA: Diagnosis not present

## 2021-03-02 DIAGNOSIS — Z7984 Long term (current) use of oral hypoglycemic drugs: Secondary | ICD-10-CM | POA: Diagnosis not present

## 2021-03-02 HISTORY — DX: Unspecified osteoarthritis, unspecified site: M19.90

## 2021-03-02 HISTORY — PX: CATARACT EXTRACTION W/PHACO: SHX586

## 2021-03-02 LAB — GLUCOSE, CAPILLARY
Glucose-Capillary: 100 mg/dL — ABNORMAL HIGH (ref 70–99)
Glucose-Capillary: 129 mg/dL — ABNORMAL HIGH (ref 70–99)

## 2021-03-02 SURGERY — PHACOEMULSIFICATION, CATARACT, WITH IOL INSERTION
Anesthesia: Monitor Anesthesia Care | Site: Eye | Laterality: Right

## 2021-03-02 MED ORDER — LIDOCAINE HCL (PF) 2 % IJ SOLN
INTRAOCULAR | Status: DC | PRN
  Administered 2021-03-02: 1 mL via INTRAOCULAR

## 2021-03-02 MED ORDER — EPINEPHRINE PF 1 MG/ML IJ SOLN
INTRAOCULAR | Status: DC | PRN
  Administered 2021-03-02: 87 mL via OPHTHALMIC

## 2021-03-02 MED ORDER — MOXIFLOXACIN HCL 0.5 % OP SOLN
OPHTHALMIC | Status: DC | PRN
  Administered 2021-03-02: 0.2 mL via OPHTHALMIC

## 2021-03-02 MED ORDER — TETRACAINE HCL 0.5 % OP SOLN
1.0000 [drp] | OPHTHALMIC | Status: DC | PRN
  Administered 2021-03-02 (×2): 1 [drp] via OPHTHALMIC

## 2021-03-02 MED ORDER — SODIUM HYALURONATE 23MG/ML IO SOSY
PREFILLED_SYRINGE | INTRAOCULAR | Status: DC | PRN
  Administered 2021-03-02: 0.6 mL via INTRAOCULAR

## 2021-03-02 MED ORDER — FENTANYL CITRATE (PF) 100 MCG/2ML IJ SOLN
INTRAMUSCULAR | Status: DC | PRN
  Administered 2021-03-02: 50 ug via INTRAVENOUS

## 2021-03-02 MED ORDER — ARMC OPHTHALMIC DILATING DROPS
1.0000 "application " | OPHTHALMIC | Status: DC | PRN
  Administered 2021-03-02 (×3): 1 via OPHTHALMIC

## 2021-03-02 MED ORDER — SODIUM HYALURONATE 10 MG/ML IO SOLUTION
PREFILLED_SYRINGE | INTRAOCULAR | Status: DC | PRN
  Administered 2021-03-02: 0.55 mL via INTRAOCULAR

## 2021-03-02 MED ORDER — MIDAZOLAM HCL 2 MG/2ML IJ SOLN
INTRAMUSCULAR | Status: DC | PRN
  Administered 2021-03-02: 1 mg via INTRAVENOUS

## 2021-03-02 MED ORDER — LACTATED RINGERS IV SOLN: INTRAVENOUS | Status: DC

## 2021-03-02 SURGICAL SUPPLY — 19 items
CANNULA ANT/CHMB 27G (MISCELLANEOUS) ×2 IMPLANT
CANNULA ANT/CHMB 27GA (MISCELLANEOUS) ×4 IMPLANT
DISSECTOR HYDRO NUCLEUS 50X22 (MISCELLANEOUS) ×2 IMPLANT
GLOVE PI ULTRA LF STRL 7.5 (GLOVE) ×1 IMPLANT
GLOVE PI ULTRA NON LATEX 7.5 (GLOVE) ×1
GLOVE SURG SYN 8.5  E (GLOVE) ×2
GLOVE SURG SYN 8.5 E (GLOVE) ×1 IMPLANT
GLOVE SURG SYN 8.5 PF PI (GLOVE) ×1 IMPLANT
GOWN STRL REUS W/ TWL LRG LVL3 (GOWN DISPOSABLE) ×2 IMPLANT
GOWN STRL REUS W/TWL LRG LVL3 (GOWN DISPOSABLE) ×4
LENS IOL TECNIS EYHANCE 27.0 (Intraocular Lens) ×1 IMPLANT
MARKER SKIN DUAL TIP RULER LAB (MISCELLANEOUS) ×2 IMPLANT
PACK DR. KING ARMS (PACKS) ×2 IMPLANT
PACK EYE AFTER SURG (MISCELLANEOUS) ×2 IMPLANT
PACK OPTHALMIC (MISCELLANEOUS) ×2 IMPLANT
SYR 3ML LL SCALE MARK (SYRINGE) ×2 IMPLANT
SYR TB 1ML LUER SLIP (SYRINGE) ×2 IMPLANT
WATER STERILE IRR 250ML POUR (IV SOLUTION) ×2 IMPLANT
WIPE NON LINTING 3.25X3.25 (MISCELLANEOUS) ×2 IMPLANT

## 2021-03-02 NOTE — H&P (Signed)
[place]   Primary Care Physician:  Rusty Aus, MD Ophthalmologist: Dr. Benay Pillow  Pre-Procedure History & Physical: HPI:  Melvin Johnson is a 80 y.o. male here for cataract surgery.   Past Medical History:  Diagnosis Date  . Arthritis   . At risk for sleep apnea    STOP-BANG=4        SENT TO PCP 06-26-2014  . Coronary artery disease    no cardiologist for several years , sees pcp  dr Elta Guadeloupe Sabra Heck  . History of MI (myocardial infarction)    may 1999  . Hyperlipidemia   . Hypertension   . Mild acid reflux   . Neuropathy of both feet    burning sensation  . Nocturia   . Prostate cancer ([place]) 03/2014   Gleason 3+3=6, seed implant 07/03/14  . S/P CABG x 4   . Seasonal allergies   . Type 2 diabetes mellitus ([place])   . Wears dentures    UPPER  . Wears glasses     Past Surgical History:  Procedure Laterality Date  . CARDIAC CATHETERIZATION  1999  ([place])  . CORONARY ARTERY BYPASS GRAFT  2003   dr vantright   4 VESSEL  . 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  . RADIOACTIVE SEED IMPLANT N/A 07/03/2014   Procedure: RADIOACTIVE SEED IMPLANT;  Surgeon: Bernestine Amass, MD;  Location: [facility];  Service: Urology;  Laterality: N/A;  . TONSILLECTOMY  as child    Prior to Admission medications   Medication Sig Start Date End Date Taking? Authorizing Provider  ASPIRIN 81 PO Take 81 mg by mouth.   Yes [provider]  atenolol (TENORMIN) 50 MG tablet Take 50 mg by mouth every morning.  02/06/14  Yes [provider]  glimepiride (AMARYL) 4 MG tablet Take 4 mg by mouth daily with breakfast.   Yes [provider]  hydrochlorothiazide (HYDRODIURIL) 25 MG tablet Take 25 mg by mouth every morning.  02/06/14  Yes [provider]  lisinopril (PRINIVIL,ZESTRIL) 20 MG tablet Take 20 mg by mouth every morning.  02/06/14  Yes [provider]  metFORMIN (GLUCOPHAGE) 500 MG tablet Take 500 mg by mouth 2 (two) times  daily with a meal. 2 tabs in am and 1 tabs in pm   Yes [provider]  Multiple Vitamins-Minerals (CENTRUM SILVER ADULT 50+ PO) Take 1 tablet by mouth daily.   Yes [provider]  simvastatin (ZOCOR) 40 MG tablet Take 40 mg by mouth at bedtime. 02/06/14  Yes [provider]  docusate sodium (COLACE) 100 MG capsule Take 100 mg by mouth every evening. Patient not taking: Reported on 02/19/2021    [provider]  glipiZIDE (GLUCOTROL) 10 MG tablet Take 5 mg by mouth 2 (two) times daily before a meal. Patient not taking: Reported on 02/19/2021    [provider]  HYDROcodone-acetaminophen (NORCO/VICODIN) 5-325 MG per tablet Take 1-2 tablets by mouth every 6 (six) hours as needed. Patient not taking: Reported on 02/19/2021 07/03/14   Rana Snare, MD  liraglutide (VICTOZA) 18 MG/3ML SOPN Inject 1.2 Units into the skin every morning. Patient not taking: Reported on 02/19/2021    [provider]  sildenafil (REVATIO) 20 MG tablet Take 20 mg by mouth as needed. Patient not taking: Reported on 02/19/2021    [provider]    Allergies as of 02/13/2021 - Review Complete 07/23/2014  Allergen Reaction Noted  . Iodine Other (See  Comments) 05/02/2014    Family History  Problem Relation Age of Onset  . Cancer Mother        lung  . Heart attack Father   . Cancer Brother        lung    Social History   Socioeconomic History  . Marital status: Married    Spouse name: Not on file  . Number of children: Not on file  . Years of education: Not on file  . Highest education level: Not on file  Occupational History  . Not on file  Tobacco Use  . Smoking status: Former Smoker    Packs/day: 0.50    Years: 15.00    Pack years: 7.50    Types: Cigarettes    Quit date: 05/02/1998    Years since quitting: 22.8  . Smokeless tobacco: Never Used  Substance and Sexual Activity  . Alcohol use: No  . Drug use: No  . Sexual activity: Not on file   Other Topics Concern  . Not on file  Social History Narrative  . Not on file   Social Determinants of Health   Financial Resource Strain: Not on file  Food Insecurity: Not on file  Transportation Needs: Not on file  Physical Activity: Not on file  Stress: Not on file  Social Connections: Not on file  Intimate Partner Violence: Not on file    Review of Systems: See HPI, otherwise negative ROS  Physical Exam: BP (!) 148/61   Pulse 62   Temp 97.7 F (36.5 C) (Temporal)   Resp 16   Ht 5\' 10"  (1.778 m)   Wt 99 kg   SpO2 96%   BMI 31.32 kg/m  General:   Alert,  pleasant and cooperative in NAD Head:  Normocephalic and atraumatic. Respiratory:  Normal work of breathing. Cardiovascular:  RRR  Impression/Plan: Melvin Johnson is here for cataract surgery.  Risks, benefits, limitations, and alternatives regarding cataract surgery have been reviewed with the patient.  Questions have been answered.  All parties agreeable.   Benay Pillow, MD  03/02/2021, 8:26 AM

## 2021-03-02 NOTE — Anesthesia Postprocedure Evaluation (Signed)
Anesthesia Post Note  Patient: Melvin Johnson  Procedure(s) Performed: CATARACT EXTRACTION PHACO AND INTRAOCULAR LENS PLACEMENT (IOC) RIGHT DIABETIC 5.79 00:45.2 (Right Eye)     Patient location during evaluation: PACU Anesthesia Type: MAC Level of consciousness: awake and alert, oriented and patient cooperative Pain management: pain level controlled Vital Signs Assessment: post-procedure vital signs reviewed and stable Respiratory status: spontaneous breathing, nonlabored ventilation and respiratory function stable Cardiovascular status: blood pressure returned to baseline and stable Postop Assessment: adequate PO intake Anesthetic complications: no   No complications documented.  Darrin Nipper

## 2021-03-02 NOTE — Transfer of Care (Signed)
Immediate Anesthesia Transfer of Care Note  Patient: Melvin Johnson  Procedure(s) Performed: CATARACT EXTRACTION PHACO AND INTRAOCULAR LENS PLACEMENT (IOC) RIGHT DIABETIC 5.79 00:45.2 (Right Eye)  Patient Location: PACU  Anesthesia Type: MAC  Level of Consciousness: awake, alert  and patient cooperative  Airway and Oxygen Therapy: Patient Spontanous Breathing and Patient connected to supplemental oxygen  Post-op Assessment: Post-op Vital signs reviewed, Patient's Cardiovascular Status Stable, Respiratory Function Stable, Patent Airway and No signs of Nausea or vomiting  Post-op Vital Signs: Reviewed and stable  Complications: No complications documented.

## 2021-03-02 NOTE — Anesthesia Procedure Notes (Signed)
Procedure Name: MAC Date/Time: 03/02/2021 8:34 AM Performed by: Cameron Ali, CRNA Pre-anesthesia Checklist: Patient identified, Emergency Drugs available, Suction available, Timeout performed and Patient being monitored Patient Re-evaluated:Patient Re-evaluated prior to induction Oxygen Delivery Method: Nasal cannula Placement Confirmation: positive ETCO2

## 2021-03-02 NOTE — Op Note (Signed)
OPERATIVE NOTE  Melvin Johnson [id number] 03/02/2021   PREOPERATIVE DIAGNOSIS:  Nuclear sclerotic cataract right eye.  H25.11   POSTOPERATIVE DIAGNOSIS:    Nuclear sclerotic cataract right eye.     PROCEDURE:  Phacoemusification with posterior chamber intraocular lens placement of the right eye   LENS:   Implant Name Type Inv. Item Serial No. Manufacturer Lot No. LRB No. Used Action  LENS IOL TECNIS EYHANCE 27.0 - [id number] Intraocular Lens LENS IOL TECNIS EYHANCE 27.0 [id number] [place]   Right 1 Implanted       Procedure(s): CATARACT EXTRACTION PHACO AND INTRAOCULAR LENS PLACEMENT (IOC) RIGHT DIABETIC 5.79 00:45.2 (Right)  DIB00 +27.0   SURGEON:  Benay Pillow, MD, MPH  ANESTHESIOLOGIST: Anesthesiologist: Darrin Nipper, MD CRNA: Cameron Ali, CRNA   ANESTHESIA:  Topical with tetracaine drops augmented with 1% preservative-free intracameral lidocaine.  ESTIMATED BLOOD LOSS: less than 1 mL.   COMPLICATIONS:  None.   DESCRIPTION OF PROCEDURE:  The patient was identified in the holding room and transported to the operating room and placed in the supine position under the operating microscope.  The right eye was identified as the operative eye and it was prepped and draped in the usual sterile ophthalmic fashion.   A 1.0 millimeter clear-corneal paracentesis was made at the 10:30 position. 0.5 ml of preservative-free 1% lidocaine with epinephrine was injected into the anterior chamber.  The anterior chamber was filled with Healon 5 viscoelastic.  A 2.4 millimeter keratome was used to make a near-clear corneal incision at the 8:00 position.  A curvilinear capsulorrhexis was made with a cystotome and capsulorrhexis forceps.  Balanced salt solution was used to hydrodissect and hydrodelineate the nucleus.   Phacoemulsification was then used in stop and chop fashion to remove the lens nucleus and epinucleus.  The remaining cortex was then removed using the irrigation and aspiration  handpiece. Healon was then placed into the capsular bag to distend it for lens placement.  A lens was then injected into the capsular bag.  The remaining viscoelastic was aspirated.   Wounds were hydrated with balanced salt solution.  The anterior chamber was inflated to a physiologic pressure with balanced salt solution.   Intracameral vigamox 0.1 mL undiluted was injected into the eye and a drop placed onto the ocular surface.  No wound leaks were noted.  The patient was taken to the recovery room in stable condition without complications of anesthesia or surgery  Benay Pillow 03/02/2021, 8:59 AM

## 2021-03-03 ENCOUNTER — Encounter: Payer: Self-pay | Admitting: Ophthalmology

## 2021-03-05 ENCOUNTER — Other Ambulatory Visit: Payer: Self-pay

## 2021-03-12 NOTE — Discharge Instructions (Signed)

## 2021-03-16 ENCOUNTER — Other Ambulatory Visit: Payer: Self-pay

## 2021-03-16 ENCOUNTER — Ambulatory Visit: Payer: Medicare HMO | Admitting: Anesthesiology

## 2021-03-16 ENCOUNTER — Ambulatory Visit
Admission: RE | Admit: 2021-03-16 | Discharge: 2021-03-16 | Disposition: A | Discharge status: Home / Self Care | Payer: Medicare HMO | Source: Ambulatory Visit | Attending: Ophthalmology | Admitting: Ophthalmology

## 2021-03-16 ENCOUNTER — Encounter
Admission: RE | Disposition: A | Discharge status: Home / Self Care | Payer: Self-pay | Source: Ambulatory Visit | Attending: Ophthalmology

## 2021-03-16 ENCOUNTER — Encounter: Payer: Self-pay | Admitting: Ophthalmology

## 2021-03-16 DIAGNOSIS — E1122 Type 2 diabetes mellitus with diabetic chronic kidney disease: Secondary | ICD-10-CM | POA: Insufficient documentation

## 2021-03-16 DIAGNOSIS — N183 Chronic kidney disease, stage 3 unspecified: Secondary | ICD-10-CM | POA: Diagnosis not present

## 2021-03-16 DIAGNOSIS — Z91041 Radiographic dye allergy status: Secondary | ICD-10-CM | POA: Insufficient documentation

## 2021-03-16 DIAGNOSIS — Z7982 Long term (current) use of aspirin: Secondary | ICD-10-CM | POA: Diagnosis not present

## 2021-03-16 DIAGNOSIS — E1136 Type 2 diabetes mellitus with diabetic cataract: Secondary | ICD-10-CM | POA: Insufficient documentation

## 2021-03-16 DIAGNOSIS — Z7984 Long term (current) use of oral hypoglycemic drugs: Secondary | ICD-10-CM | POA: Insufficient documentation

## 2021-03-16 DIAGNOSIS — E1142 Type 2 diabetes mellitus with diabetic polyneuropathy: Secondary | ICD-10-CM | POA: Diagnosis not present

## 2021-03-16 DIAGNOSIS — Z79899 Other long term (current) drug therapy: Secondary | ICD-10-CM | POA: Diagnosis not present

## 2021-03-16 DIAGNOSIS — Z87891 Personal history of nicotine dependence: Secondary | ICD-10-CM | POA: Insufficient documentation

## 2021-03-16 DIAGNOSIS — Z8249 Family history of ischemic heart disease and other diseases of the circulatory system: Secondary | ICD-10-CM | POA: Insufficient documentation

## 2021-03-16 DIAGNOSIS — I129 Hypertensive chronic kidney disease with stage 1 through stage 4 chronic kidney disease, or unspecified chronic kidney disease: Secondary | ICD-10-CM | POA: Insufficient documentation

## 2021-03-16 DIAGNOSIS — H2512 Age-related nuclear cataract, left eye: Secondary | ICD-10-CM | POA: Diagnosis not present

## 2021-03-16 DIAGNOSIS — Z801 Family history of malignant neoplasm of trachea, bronchus and lung: Secondary | ICD-10-CM | POA: Diagnosis not present

## 2021-03-16 DIAGNOSIS — Z951 Presence of aortocoronary bypass graft: Secondary | ICD-10-CM | POA: Diagnosis not present

## 2021-03-16 DIAGNOSIS — Z8546 Personal history of malignant neoplasm of prostate: Secondary | ICD-10-CM | POA: Diagnosis not present

## 2021-03-16 HISTORY — PX: CATARACT EXTRACTION W/PHACO: SHX586

## 2021-03-16 LAB — GLUCOSE, CAPILLARY
Glucose-Capillary: 95 mg/dL (ref 70–99)
Glucose-Capillary: 109 mg/dL — ABNORMAL HIGH (ref 70–99)

## 2021-03-16 SURGERY — PHACOEMULSIFICATION, CATARACT, WITH IOL INSERTION
Anesthesia: Monitor Anesthesia Care | Site: Eye | Laterality: Left

## 2021-03-16 MED ORDER — ONDANSETRON HCL 4 MG/2ML IJ SOLN: 4.0000 mg | Freq: Once | INTRAMUSCULAR | Status: DC | PRN

## 2021-03-16 MED ORDER — EPINEPHRINE PF 1 MG/ML IJ SOLN
INTRAOCULAR | Status: DC | PRN
  Administered 2021-03-16: 84 mL via OPHTHALMIC

## 2021-03-16 MED ORDER — SODIUM HYALURONATE 10 MG/ML IO SOLUTION
PREFILLED_SYRINGE | INTRAOCULAR | Status: DC | PRN
  Administered 2021-03-16: 0.55 mL via INTRAOCULAR

## 2021-03-16 MED ORDER — MIDAZOLAM HCL 2 MG/2ML IJ SOLN
INTRAMUSCULAR | Status: DC | PRN
  Administered 2021-03-16: 1 mg via INTRAVENOUS

## 2021-03-16 MED ORDER — SODIUM HYALURONATE 23MG/ML IO SOSY
PREFILLED_SYRINGE | INTRAOCULAR | Status: DC | PRN
  Administered 2021-03-16: 0.6 mL via INTRAOCULAR

## 2021-03-16 MED ORDER — ACETAMINOPHEN 160 MG/5ML PO SOLN
325.0000 mg | ORAL | Status: DC | PRN
Start: 2021-03-16 — End: 2021-03-16

## 2021-03-16 MED ORDER — LIDOCAINE HCL (PF) 2 % IJ SOLN
INTRAOCULAR | Status: DC | PRN
  Administered 2021-03-16: 1 mL via INTRAOCULAR

## 2021-03-16 MED ORDER — FENTANYL CITRATE (PF) 100 MCG/2ML IJ SOLN
INTRAMUSCULAR | Status: DC | PRN
  Administered 2021-03-16: 50 ug via INTRAVENOUS

## 2021-03-16 MED ORDER — MOXIFLOXACIN HCL 0.5 % OP SOLN
OPHTHALMIC | Status: DC | PRN
  Administered 2021-03-16: 0.2 mL via OPHTHALMIC

## 2021-03-16 MED ORDER — ARMC OPHTHALMIC DILATING DROPS
1.0000 "application " | OPHTHALMIC | Status: DC | PRN
  Administered 2021-03-16 (×3): 1 via OPHTHALMIC

## 2021-03-16 MED ORDER — TETRACAINE HCL 0.5 % OP SOLN
1.0000 [drp] | OPHTHALMIC | Status: DC | PRN
  Administered 2021-03-16 (×3): 1 [drp] via OPHTHALMIC

## 2021-03-16 MED ORDER — ACETAMINOPHEN 325 MG PO TABS: 325.0000 mg | ORAL_TABLET | ORAL | Status: DC | PRN

## 2021-03-16 SURGICAL SUPPLY — 19 items
CANNULA ANT/CHMB 27G (MISCELLANEOUS) ×2 IMPLANT
CANNULA ANT/CHMB 27GA (MISCELLANEOUS) ×4 IMPLANT
DISSECTOR HYDRO NUCLEUS 50X22 (MISCELLANEOUS) ×2 IMPLANT
GLOVE PI ULTRA LF STRL 7.5 (GLOVE) ×1 IMPLANT
GLOVE PI ULTRA NON LATEX 7.5 (GLOVE) ×1
GLOVE SURG SYN 8.5  E (GLOVE) ×2
GLOVE SURG SYN 8.5 E (GLOVE) ×1 IMPLANT
GLOVE SURG SYN 8.5 PF PI (GLOVE) ×1 IMPLANT
GOWN STRL REUS W/ TWL LRG LVL3 (GOWN DISPOSABLE) ×2 IMPLANT
GOWN STRL REUS W/TWL LRG LVL3 (GOWN DISPOSABLE) ×4
LENS IOL TECNIS EYHANCE 26.5 (Intraocular Lens) ×1 IMPLANT
MARKER SKIN DUAL TIP RULER LAB (MISCELLANEOUS) ×2 IMPLANT
PACK DR. KING ARMS (PACKS) ×2 IMPLANT
PACK EYE AFTER SURG (MISCELLANEOUS) ×2 IMPLANT
PACK OPTHALMIC (MISCELLANEOUS) ×2 IMPLANT
SYR 3ML LL SCALE MARK (SYRINGE) ×2 IMPLANT
SYR TB 1ML LUER SLIP (SYRINGE) ×2 IMPLANT
WATER STERILE IRR 250ML POUR (IV SOLUTION) ×2 IMPLANT
WIPE NON LINTING 3.25X3.25 (MISCELLANEOUS) ×2 IMPLANT

## 2021-03-16 NOTE — H&P (Signed)
Melvin Johnson   Primary Care Physician:  Rusty Aus, MD Ophthalmologist: Dr. Benay Pillow  Pre-Procedure History & Physical: HPI:  Melvin Johnson is a 80 y.o. male here for cataract surgery.   Past Medical History:  Diagnosis Date  . Arthritis   . At risk for sleep apnea    STOP-BANG=4        SENT TO PCP 06-26-2014  . Coronary artery disease    no cardiologist for several years , sees pcp  dr Elta Guadeloupe Sabra Heck  . History of MI (myocardial infarction)    may 1999  . Hyperlipidemia   . Hypertension   . Mild acid reflux   . Neuropathy of both feet    burning sensation  . Nocturia   . Prostate cancer ([place]) 03/2014   Gleason 3+3=6, seed implant 07/03/14  . S/P CABG x 4   . Seasonal allergies   . Type 2 diabetes mellitus ([place])   . Wears dentures    UPPER  . Wears glasses     Past Surgical History:  Procedure Laterality Date  . CARDIAC CATHETERIZATION  1999  ([place])  . CATARACT EXTRACTION W/PHACO Right 03/02/2021   Procedure: CATARACT EXTRACTION PHACO AND INTRAOCULAR LENS PLACEMENT (IOC) RIGHT DIABETIC 5.79 00:45.2;  Surgeon: Eulogio Bear, MD;  Location: [facility];  Service: Ophthalmology;  Laterality: Right;  . CORONARY ARTERY BYPASS GRAFT  2003   dr vantright   4 VESSEL  . 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  . RADIOACTIVE SEED IMPLANT N/A 07/03/2014   Procedure: RADIOACTIVE SEED IMPLANT;  Surgeon: Bernestine Amass, MD;  Location: [facility];  Service: Urology;  Laterality: N/A;  . TONSILLECTOMY  as child    Prior to Admission medications   Medication Sig Start Date End Date Taking? Authorizing Provider  ASPIRIN 81 PO Take 81 mg by mouth.   Yes [provider]  atenolol (TENORMIN) 50 MG tablet Take 50 mg by mouth every morning.  02/06/14  Yes [provider]  docusate sodium (COLACE) 100 MG capsule Take 100 mg by mouth every evening.   Yes [provider]  glimepiride (AMARYL) 4 MG tablet Take 4 mg by  mouth daily with breakfast.   Yes [provider]  glipiZIDE (GLUCOTROL) 10 MG tablet Take 5 mg by mouth 2 (two) times daily before a meal.   Yes [provider]  hydrochlorothiazide (HYDRODIURIL) 25 MG tablet Take 25 mg by mouth every morning.  02/06/14  Yes [provider]  HYDROcodone-acetaminophen (NORCO/VICODIN) 5-325 MG per tablet Take 1-2 tablets by mouth every 6 (six) hours as needed. 07/03/14  Yes Rana Snare, MD  liraglutide (VICTOZA) 18 MG/3ML SOPN Inject 1.2 Units into the skin every morning.   Yes [provider]  lisinopril (PRINIVIL,ZESTRIL) 20 MG tablet Take 20 mg by mouth every morning.  02/06/14  Yes [provider]  metFORMIN (GLUCOPHAGE) 500 MG tablet Take 500 mg by mouth 2 (two) times daily with a meal. 2 tabs in am and 1 tabs in pm   Yes [provider]  Multiple Vitamins-Minerals (CENTRUM SILVER ADULT 50+ PO) Take 1 tablet by mouth daily.   Yes [provider]  sildenafil (REVATIO) 20 MG tablet Take 20 mg by mouth as needed.   Yes [provider]  simvastatin (ZOCOR) 40 MG tablet Take 40 mg by mouth at bedtime. 02/06/14  Yes [provider]    Allergies as of 02/13/2021 - Review Complete 07/23/2014  Allergen Reaction Noted  . Iodine Other (See Comments) 05/02/2014    Family History  Problem Relation Age of Onset  . Cancer Mother        lung  . Heart attack Father   . Cancer Brother        lung    Social History   Socioeconomic History  . Marital status: Married    Spouse name: Not on file  . Number of children: Not on file  . Years of education: Not on file  . Highest education level: Not on file  Occupational History  . Not on file  Tobacco Use  . Smoking status: Former Smoker    Packs/day: 0.50    Years: 15.00    Pack years: 7.50    Types: Cigarettes    Quit date: 05/02/1998    Years since quitting: 22.8  . Smokeless tobacco: Never Used  Substance and Sexual Activity  .  Alcohol use: No  . Drug use: No  . Sexual activity: Not on file  Other Topics Concern  . Not on file  Social History Narrative  . Not on file   Social Determinants of Health   Financial Resource Strain: Not on file  Food Insecurity: Not on file  Transportation Needs: Not on file  Physical Activity: Not on file  Stress: Not on file  Social Connections: Not on file  Intimate Partner Violence: Not on file    Review of Systems: See HPI, otherwise negative ROS  Physical Exam: BP (!) 146/68   Pulse (!) 58   Temp (!) 97.1 F (36.2 C) (Temporal)   Resp 20   Ht 5\' 10"  (1.778 m)   Wt 99 kg   SpO2 99%   BMI 31.32 kg/m  General:   Alert,  pleasant and cooperative in NAD Head:  Normocephalic and atraumatic. Respiratory:  Normal work of breathing. Cardiovascular:  RRR  Impression/Plan: Melvin Johnson is here for cataract surgery.  Risks, benefits, limitations, and alternatives regarding cataract surgery have been reviewed with the patient.  Questions have been answered.  All parties agreeable.   Benay Pillow, MD  03/16/2021, 10:34 AM

## 2021-03-16 NOTE — Anesthesia Preprocedure Evaluation (Signed)
Anesthesia Evaluation  Patient identified by MRN, date of birth, ID band Patient awake    Reviewed: Allergy & Precautions, NPO status   History of Anesthesia Complications Negative for: history of anesthetic complications  Airway Mallampati: IV   Neck ROM: Full    Dental  (+) Upper Dentures, Caps   Pulmonary former smoker,    Pulmonary exam normal        Cardiovascular hypertension, + CAD (s/p MI and CABG)   Rhythm:Regular Rate:Normal     Neuro/Psych    GI/Hepatic GERD  ,  Endo/Other  diabetes, Type 2  Renal/GU Renal disease (stage III CKD)     Musculoskeletal  (+) Arthritis ,   Abdominal   Peds  Hematology Prostate CA   Anesthesia Other Findings   Reproductive/Obstetrics                             Anesthesia Physical  Anesthesia Plan  ASA: III  Anesthesia Plan: MAC   Post-op Pain Management:    Induction: Intravenous  PONV Risk Score and Plan: 1 and TIVA, Midazolam and Treatment may vary due to age or medical condition  Airway Management Planned: Nasal Cannula  Additional Equipment:   Intra-op Plan:   Post-operative Plan:   Informed Consent: I have reviewed the patients History and Physical, chart, labs and discussed the procedure including the risks, benefits and alternatives for the proposed anesthesia with the patient or authorized representative who has indicated his/her understanding and acceptance.       Plan Discussed with: CRNA  Anesthesia Plan Comments:         Anesthesia Quick Evaluation

## 2021-03-16 NOTE — Anesthesia Procedure Notes (Signed)
Procedure Name: MAC Date/Time: 03/16/2021 10:47 AM Performed by: Cameron Ali, CRNA Pre-anesthesia Checklist: Patient identified, Emergency Drugs available, Suction available, Timeout performed and Patient being monitored Patient Re-evaluated:Patient Re-evaluated prior to induction Oxygen Delivery Method: Nasal cannula Placement Confirmation: positive ETCO2

## 2021-03-16 NOTE — Anesthesia Postprocedure Evaluation (Signed)
Anesthesia Post Note  Patient: Melvin Johnson  Procedure(s) Performed: CATARACT EXTRACTION PHACO AND INTRAOCULAR LENS PLACEMENT (IOC) LEFT DIABETIC 5.90 00:41.5 (Left Eye)     Patient location during evaluation: PACU Anesthesia Type: MAC Level of consciousness: awake Pain management: pain level controlled Vital Signs Assessment: post-procedure vital signs reviewed and stable Respiratory status: respiratory function stable Cardiovascular status: stable Postop Assessment: no apparent nausea or vomiting Anesthetic complications: no   No complications documented.  Veda Canning

## 2021-03-16 NOTE — Transfer of Care (Signed)
Immediate Anesthesia Transfer of Care Note  Patient: Melvin Johnson  Procedure(s) Performed: CATARACT EXTRACTION PHACO AND INTRAOCULAR LENS PLACEMENT (IOC) LEFT DIABETIC 5.90 00:41.5 (Left Eye)  Patient Location: PACU  Anesthesia Type: MAC  Level of Consciousness: awake, alert  and patient cooperative  Airway and Oxygen Therapy: Patient Spontanous Breathing and Patient connected to supplemental oxygen  Post-op Assessment: Post-op Vital signs reviewed, Patient's Cardiovascular Status Stable, Respiratory Function Stable, Patent Airway and No signs of Nausea or vomiting  Post-op Vital Signs: Reviewed and stable  Complications: No complications documented.

## 2021-03-16 NOTE — Op Note (Signed)
OPERATIVE NOTE  Melvin Johnson [id number] 03/16/2021   PREOPERATIVE DIAGNOSIS:  Nuclear sclerotic cataract left eye.  H25.12   POSTOPERATIVE DIAGNOSIS:    Nuclear sclerotic cataract left eye.     PROCEDURE:  Phacoemusification with posterior chamber intraocular lens placement of the left eye   LENS:   Implant Name Type Inv. Item Serial No. Manufacturer Lot No. LRB No. Used Action  LENS IOL TECNIS EYHANCE 26.5 - [id number] Intraocular Lens LENS IOL TECNIS EYHANCE 26.5 [id number] [place]   Left 1 Implanted      Procedure(s): CATARACT EXTRACTION PHACO AND INTRAOCULAR LENS PLACEMENT (IOC) LEFT DIABETIC 5.90 00:41.5 (Left)  DIB00 +26.5   ULTRASOUND TIME: 0 minutes 41 seconds.  CDE 5.90   SURGEON:  Benay Pillow, MD, MPH   ANESTHESIA:  Topical with tetracaine drops augmented with 1% preservative-free intracameral lidocaine.  ESTIMATED BLOOD LOSS: <1 mL   COMPLICATIONS:  None.   DESCRIPTION OF PROCEDURE:  The patient was identified in the holding room and transported to the operating room and placed in the supine position under the operating microscope.  The left eye was identified as the operative eye and it was prepped and draped in the usual sterile ophthalmic fashion.   A 1.0 millimeter clear-corneal paracentesis was made at the 5:00 position. 0.5 ml of preservative-free 1% lidocaine with epinephrine was injected into the anterior chamber.  The anterior chamber was filled with Healon 5 viscoelastic.  A 2.4 millimeter keratome was used to make a near-clear corneal incision at the 2:00 position.  A curvilinear capsulorrhexis was made with a cystotome and capsulorrhexis forceps.  Balanced salt solution was used to hydrodissect and hydrodelineate the nucleus.   Phacoemulsification was then used in stop and chop fashion to remove the lens nucleus and epinucleus.  The remaining cortex was then removed using the irrigation and aspiration handpiece. Healon was then placed into the capsular  bag to distend it for lens placement.  A lens was then injected into the capsular bag.  The remaining viscoelastic was aspirated.   Wounds were hydrated with balanced salt solution.  The anterior chamber was inflated to a physiologic pressure with balanced salt solution.  Intracameral vigamox 0.1 mL undiltued was injected into the eye and a drop placed onto the ocular surface.  No wound leaks were noted.  The patient was taken to the recovery room in stable condition without complications of anesthesia or surgery  Benay Pillow 03/16/2021, 11:06 AM

## 2021-03-17 ENCOUNTER — Encounter: Payer: Self-pay | Admitting: Ophthalmology

## 2021-12-16 ENCOUNTER — Other Ambulatory Visit: Payer: Self-pay | Admitting: Internal Medicine

## 2021-12-16 DIAGNOSIS — I714 Abdominal aortic aneurysm, without rupture, unspecified: Secondary | ICD-10-CM

## 2021-12-25 ENCOUNTER — Other Ambulatory Visit: Payer: Self-pay

## 2021-12-25 ENCOUNTER — Ambulatory Visit
Admission: RE | Admit: 2021-12-25 | Discharge: 2021-12-25 | Disposition: A | Discharge status: Home / Self Care | Payer: Medicare HMO | Source: Ambulatory Visit | Attending: Internal Medicine | Admitting: Internal Medicine

## 2021-12-25 DIAGNOSIS — I714 Abdominal aortic aneurysm, without rupture, unspecified: Secondary | ICD-10-CM | POA: Insufficient documentation

## 2022-02-07 IMAGING — US US AORTA SCREENING (MEDICARE)
1 series · 14 of 25 positions shown · non-contrast
Comparison: None.

CLINICAL DATA: Abdominal aorta screening. History of hypertension
and prior MI.

EXAM:
ULTRASOUND OF ABDOMINAL AORTA
TECHNIQUE: Ultrasound examination of the abdominal aorta and proximal common
iliac arteries was performed to evaluate for aneurysm. Additional
color and Doppler images of the distal aorta were obtained to
document patency.

[Series 1: us aorta duplex complete · 14 of 33 slices shown]
[im 1/33]
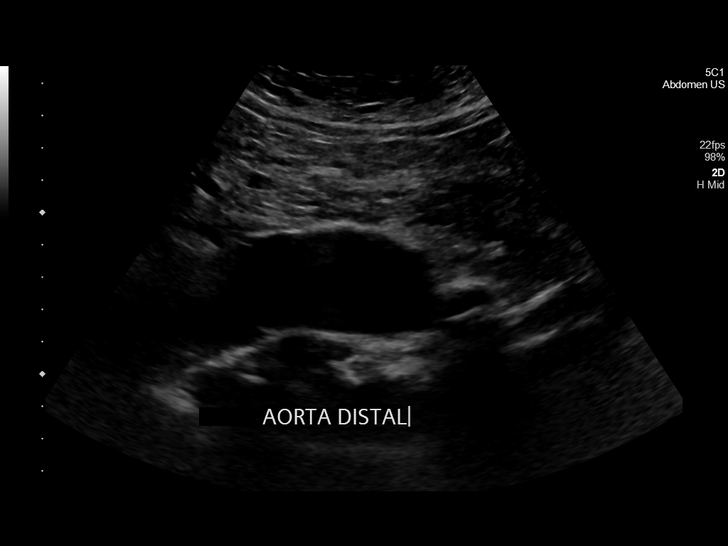
[im 3/33]
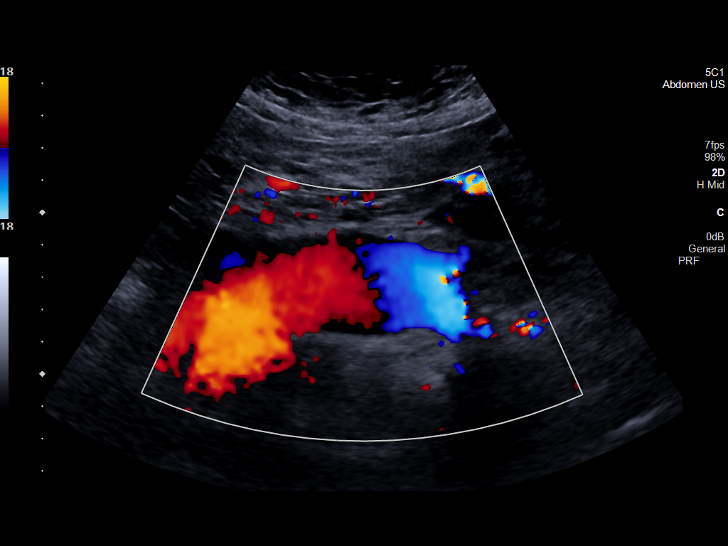
[im 6/33]
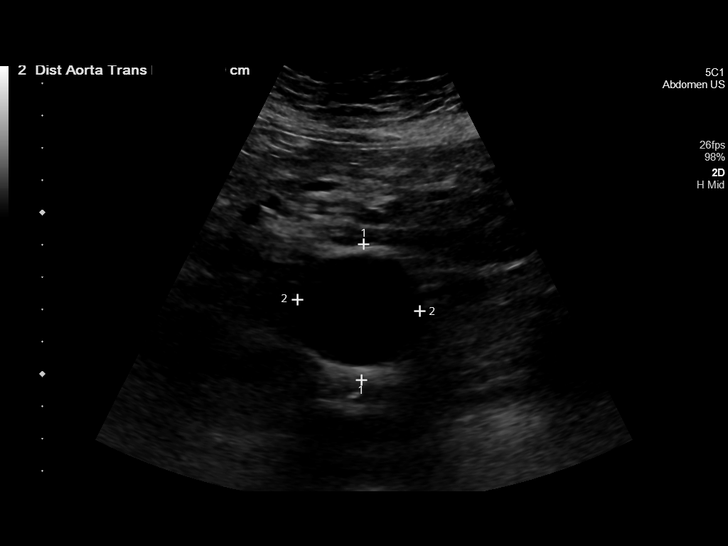
[im 9/33]
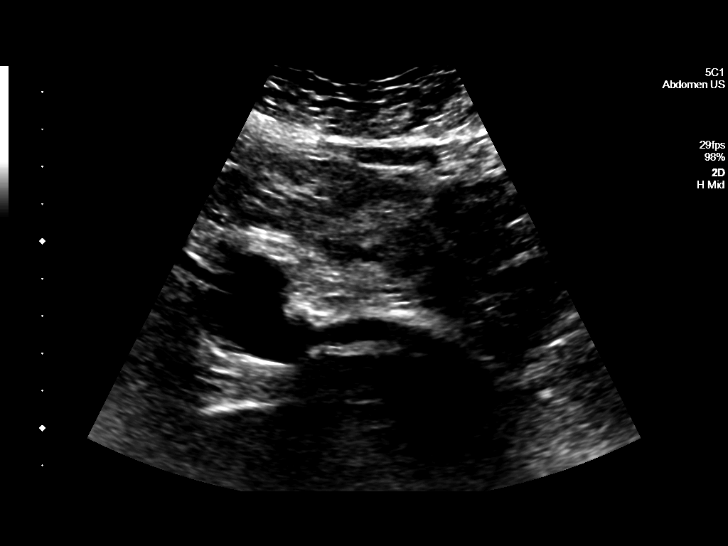
[im 11/33]
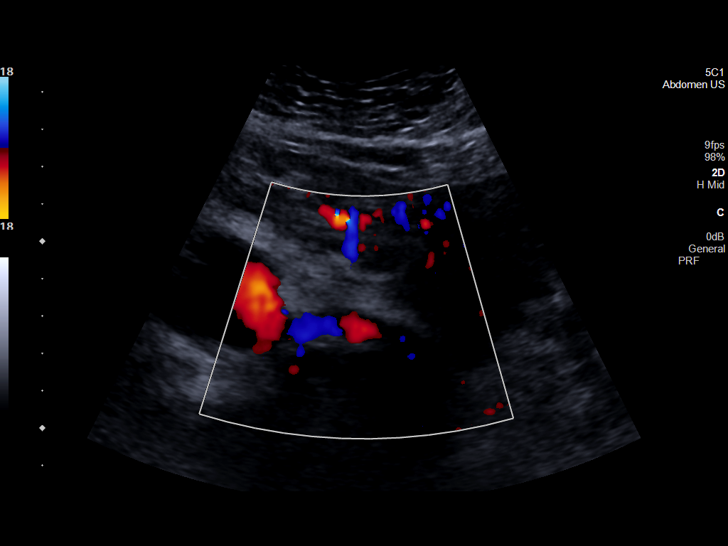
[im 13/33]
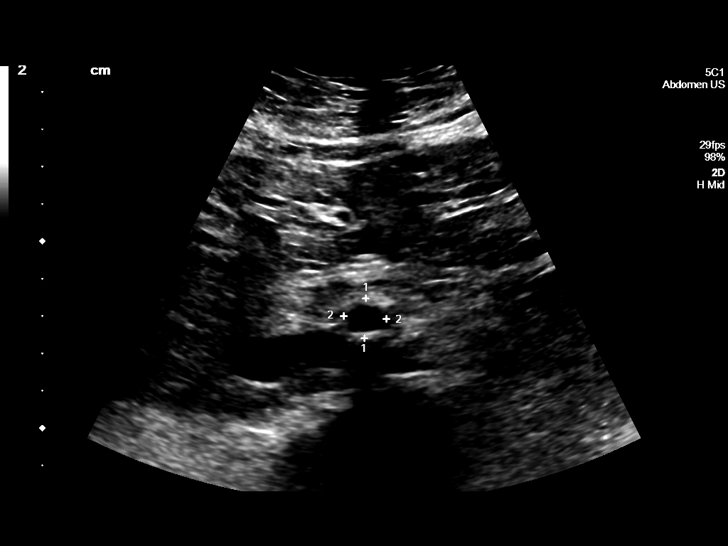
[im 15/33]
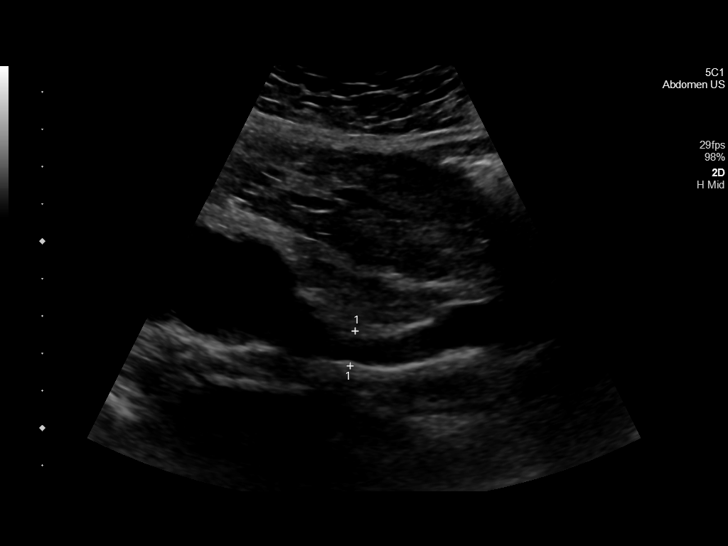
[im 18/33]
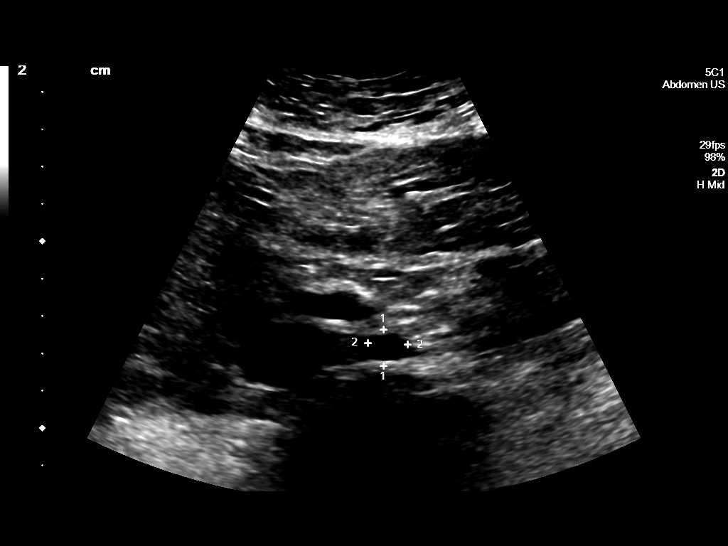
[im 21/33]
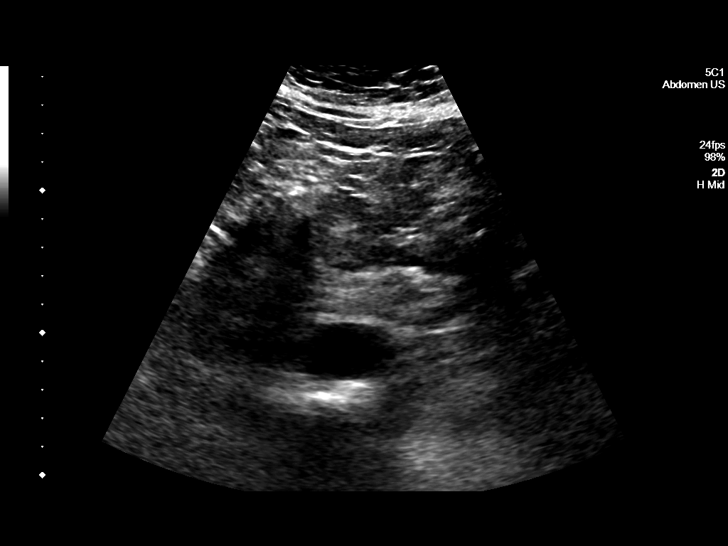
[im 22/33]
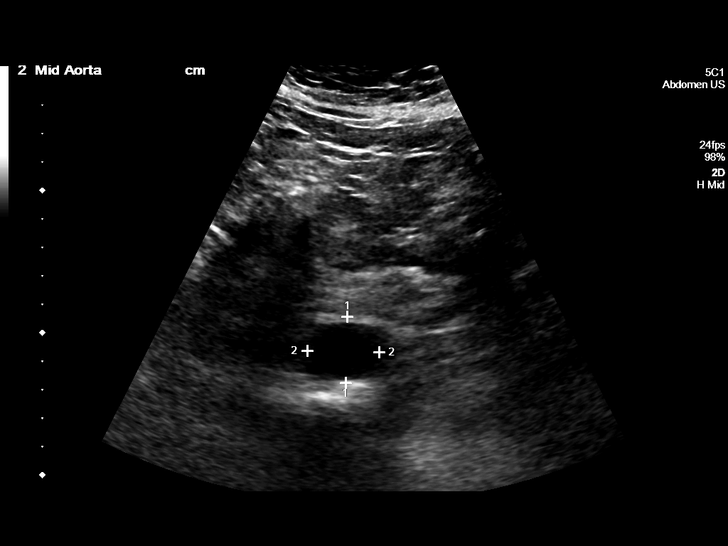
[im 25/33]
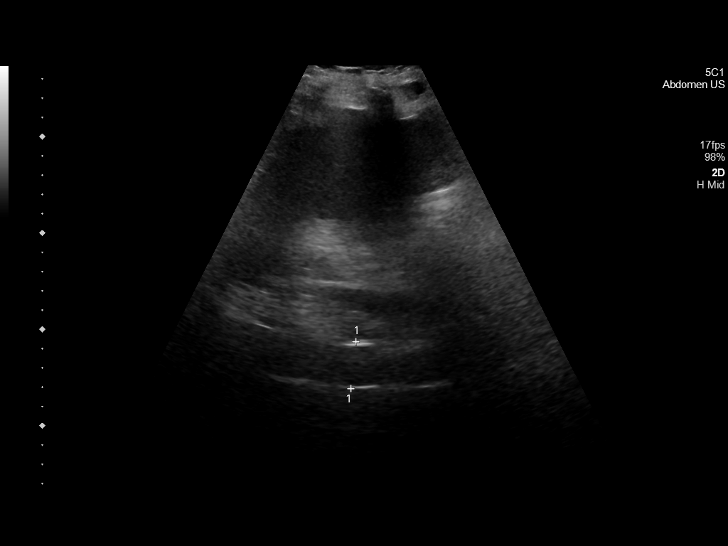
[im 27/33]
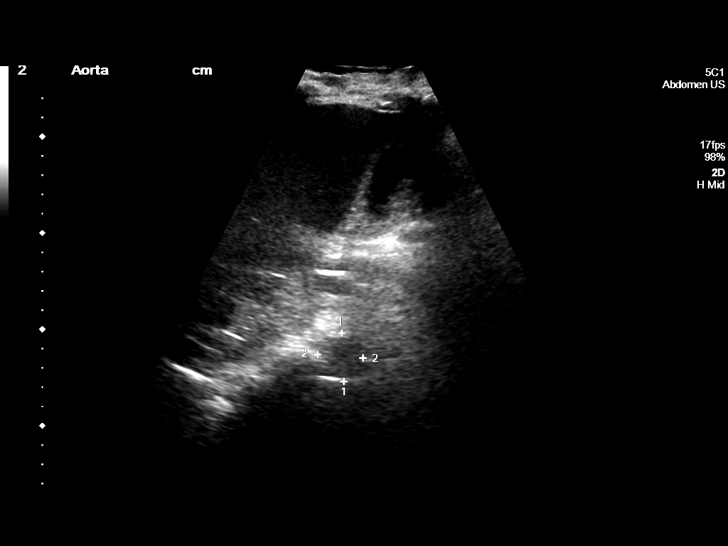
[im 30/33]
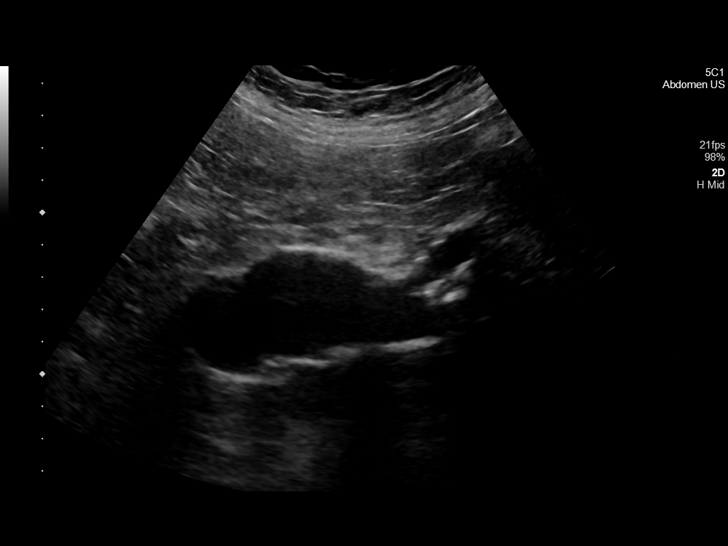
[im 33/33]
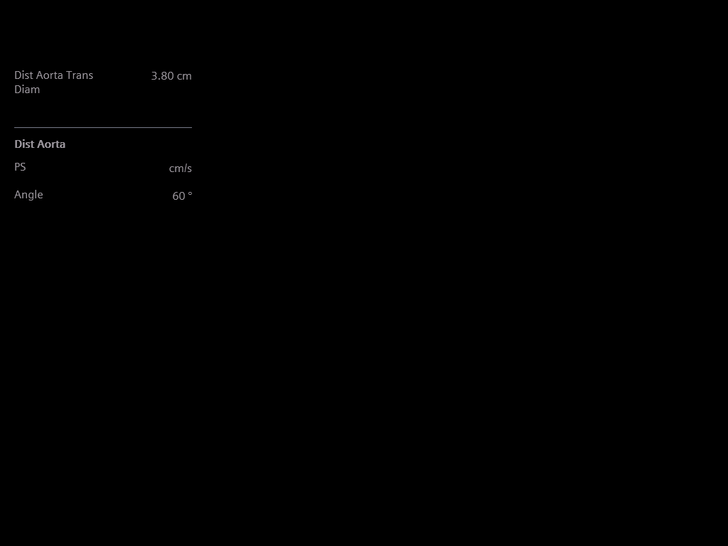

[14 of 25 positions shown; findings below may reference images not displayed]

FINDINGS: Abdominal aortic measurements as follows:

Proximal:  2.5 x 2.4 cm

Mid:  2.3 x 2.5 cm

Distal:  4.2 x 3.8 cm
Patent: Yes, peak systolic velocity is 83 cm/s

Right common iliac artery: 1.1 x 1.1 cm

Left common iliac artery: 1.0 x 1.1 cm
IMPRESSION: Distal abdominal aortic aneurysm measuring up to 4.2 cm. Recommend
follow-up every 12 months and vascular consultation. This
recommendation follows ACR consensus guidelines: White Paper of the
ACR Incidental Findings Committee II on Vascular Findings. [HOSPITAL] 6955; [DATE].

## 2022-03-23 ENCOUNTER — Other Ambulatory Visit
Admission: RE | Admit: 2022-03-23 | Discharge: 2022-03-23 | Disposition: A | Discharge status: Home / Self Care | Payer: Medicare HMO | Attending: Orthopedic Surgery | Admitting: Orthopedic Surgery

## 2022-03-23 DIAGNOSIS — Z01812 Encounter for preprocedural laboratory examination: Secondary | ICD-10-CM | POA: Insufficient documentation

## 2022-03-23 LAB — HEMOGLOBIN A1C
Hgb A1c MFr Bld: 7.5 % — ABNORMAL HIGH (ref 4.8–5.6)
Mean Plasma Glucose: 168.55 mg/dL

## 2022-08-04 ENCOUNTER — Ambulatory Visit: Payer: Medicare HMO | Attending: Orthopedic Surgery | Admitting: Physical Therapy

## 2022-08-04 DIAGNOSIS — G8929 Other chronic pain: Secondary | ICD-10-CM | POA: Insufficient documentation

## 2022-08-04 DIAGNOSIS — Z96651 Presence of right artificial knee joint: Secondary | ICD-10-CM | POA: Diagnosis present

## 2022-08-04 DIAGNOSIS — R269 Unspecified abnormalities of gait and mobility: Secondary | ICD-10-CM | POA: Diagnosis present

## 2022-08-04 DIAGNOSIS — M6281 Muscle weakness (generalized): Secondary | ICD-10-CM | POA: Diagnosis present

## 2022-08-04 DIAGNOSIS — M25561 Pain in right knee: Secondary | ICD-10-CM | POA: Insufficient documentation

## 2022-08-06 ENCOUNTER — Ambulatory Visit: Payer: Medicare HMO | Admitting: Physical Therapy

## 2022-08-06 DIAGNOSIS — R269 Unspecified abnormalities of gait and mobility: Secondary | ICD-10-CM

## 2022-08-06 DIAGNOSIS — G8929 Other chronic pain: Secondary | ICD-10-CM

## 2022-08-06 DIAGNOSIS — Z96651 Presence of right artificial knee joint: Secondary | ICD-10-CM

## 2022-08-06 DIAGNOSIS — M6281 Muscle weakness (generalized): Secondary | ICD-10-CM

## 2022-08-15 NOTE — Therapy (Signed)
OUTPATIENT PHYSICAL THERAPY LOWER EXTREMITY TREATMENT   Patient Name: Melvin Johnson MRN: [id number] DOB:May 22, 1941, 81 y.o., male Today's Date: 08/06/2022   PT End of Session - 08/15/22 1820     Visit Number 2    Number of Visits 13    Date for PT Re-Evaluation 09/15/22    PT Start Time 0932    PT Stop Time 1031    PT Time Calculation (min) 59 min             Past Medical History:  Diagnosis Date   Arthritis    At risk for sleep apnea    STOP-BANG=4        SENT TO PCP 06-26-2014   Coronary artery disease    no cardiologist for several years , sees pcp  dr mark Sabra Heck   History of MI (myocardial infarction)    may 1999   Hyperlipidemia    Hypertension    Mild acid reflux    Neuropathy of both feet    burning sensation   Nocturia    Prostate cancer ([place]) 03/2014   Gleason 3+3=6, seed implant 07/03/14   S/P CABG x 4    Seasonal allergies    Type 2 diabetes mellitus ([place])    Wears dentures    UPPER   Wears glasses    Past Surgical History:  Procedure Laterality Date   CARDIAC CATHETERIZATION  1999  ([place])   CATARACT EXTRACTION W/PHACO Right 03/02/2021   Procedure: CATARACT EXTRACTION PHACO AND INTRAOCULAR LENS PLACEMENT ([place]) RIGHT DIABETIC 5.79 00:45.2;  Surgeon: Eulogio Bear, MD;  Location: [facility];  Service: Ophthalmology;  Laterality: Right;   CATARACT EXTRACTION W/PHACO Left 03/16/2021   Procedure: CATARACT EXTRACTION PHACO AND INTRAOCULAR LENS PLACEMENT (IOC) LEFT DIABETIC 5.90 00:41.5;  Surgeon: Eulogio Bear, MD;  Location: [facility];  Service: Ophthalmology;  Laterality: Left;   CORONARY ARTERY BYPASS GRAFT  2003   dr vantright   4 VESSEL   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   RADIOACTIVE SEED IMPLANT N/A 07/03/2014   Procedure: RADIOACTIVE SEED IMPLANT;  Surgeon: Bernestine Amass, MD;  Location: [facility];  Service: Urology;  Laterality: N/A;   TONSILLECTOMY  as child   Patient Active Problem  List   Diagnosis Date Noted   Malignant neoplasm of prostate ([place]) 05/02/2014    PCP: Rusty Aus, MD   REFERRING PROVIDER: Lovell Sheehan, MD  REFERRING DIAG: History of total knee arthroplasty  THERAPY DIAG:  Hx of total knee replacement, right  Chronic pain of right knee  Muscle weakness  Gait difficulty  Rationale for Evaluation and Treatment Rehabilitation  ONSET DATE: 04/01/22  SUBJECTIVE:   SUBJECTIVE STATEMENT:    EVALUATION Pt. S/p R TKA on 04/01/22 and received skilled PT services at home.  Pt. States he was doing well until the PT was pushing on R knee to increase flexion and pt. Experienced significant pain.  Pt. Had a recent fall while getting out car and catching foot.  Pt. Reports no injury, just soreness.  Pt. Currently c/o 10/10 R knee pain, esp. With flexion/ increase activity.    PERTINENT HISTORY: See MD notes  PAIN:  Are you having pain? Yes: NPRS scale: 10/10 Pain location: R knee joint Pain description: aching/ sharp Aggravating factors: flexion/ increase activity Relieving factors: rest  PRECAUTIONS: None  WEIGHT BEARING RESTRICTIONS: No  FALLS:  Has patient fallen in last 6 months? Yes. Number of falls 1  LIVING ENVIRONMENT: Lives with: lives with their spouse Lives in: House/apartment Has following equipment at home: Single point cane  OCCUPATION: Retired, pts. Wife has AKA and requires use of w/c  PLOF: Independent  PATIENT GOALS: Increase R knee ROM/ LE strengthening to improve pain-free mobility.     OBJECTIVE:   DIAGNOSTIC FINDINGS: See [facility] noteds  PATIENT SURVEYS:  FOTO initial 38/ goal 28  COGNITION: Overall cognitive status: Within functional limits for tasks assessed     SENSATION: WFL  EDEMA:  Circumferential: L/R knee joint line:  39.5/41 cm./ mid-gastroc  36/36 cm.).    POSTURE: rounded shoulders and forward head  PALPATION: R knee joint line tenderness.  Moderate R knee/ lower leg pitting edema  as compared to L.    LOWER EXTREMITY ROM:  L/R knee: flexion (130 deg./ 107 deg.), extension (0 deg./ -2 deg.).   R knee flexion PROM: 117 deg. (15/10 pain reported).    LOWER EXTREMITY MMT:  MMT Right eval Left eval  Hip flexion 4/5 4/5  Hip extension 4/5 4/5  Hip abduction 4/5 4/5  Hip adduction    Hip internal rotation    Hip external rotation    Knee flexion 5/5 5/5  Knee extension 5/5 5/5  Ankle dorsiflexion    Ankle plantarflexion    Ankle inversion    Ankle eversion     (Blank rows = not tested)  LOWER EXTREMITY SPECIAL TESTS:  N/T  FUNCTIONAL TESTS:  5 times sit to stand: TBD SLS: <8 sec.   GAIT: Distance walked: in clinic Assistive device utilized: None Level of assistance: Complete Independence Comments: Slight R antalgic gait with initial steps after standing and increase distance walked.     TODAY'S TREATMENT      DATE: 08/06/22  There.ex.:  Nustep L4 10 min. B UE/LE.    Reviewed HEP  Supine ball ex.: knee to chest (bilateral)- 20x.    Standing hamstring stretches 4x on L/R.  Discomfort reported/ cuing for proper technique.   Manual tx.:  Supine R hamstring stretches 5x with static holds.  Supine knee flexion AA/PROM with holds (as tolerated)- pain limited  STM to L distal quad/ hamstring.  Patellar mobs (all planes)- grade III 3x 20 sec. Each.      PATIENT EDUCATION:  Education details: See HEP Person educated: Patient Education method: Explanation, Demonstration, and Handouts Education comprehension: verbalized understanding and returned demonstration  HOME EXERCISE PROGRAM: Reviewed HHPT HEP.    ASSESSMENT:  CLINICAL IMPRESSION:  Pt. Presents with R knee joint line swelling and pitting edema.  Pt. Has limited inferior/ superior patellar mobility but increase flexion ROM after manual tx./ stretches.   R knee AROM flexion 111 deg. With marked increase in knee pain. Pt. Benefits from use of SPC when walking increase distances/  stepping up on curb for safety after recent fall.  Pt. Will benefit from skilled PT services to increase R knee AROM to improve pain-free functional mobility.    OBJECTIVE IMPAIRMENTS: Abnormal gait, decreased balance, decreased coordination, decreased mobility, difficulty walking, decreased ROM, decreased strength, decreased safety awareness, hypomobility, impaired flexibility, improper body mechanics, and pain.   ACTIVITY LIMITATIONS: carrying, lifting, bending, sitting, standing, squatting, stairs, transfers, bed mobility, and locomotion level  PARTICIPATION LIMITATIONS: community activity  PERSONAL FACTORS: Age, Fitness, and Social background are also affecting patient's functional outcome.   REHAB POTENTIAL: Good  CLINICAL DECISION MAKING: Stable/uncomplicated  EVALUATION COMPLEXITY: Low   GOALS: Goals reviewed with patient? Yes  SHORT TERM GOALS: Target date:  08/25/22 Pt. Independent with HEP to increase R knee AROM to 115 deg. Flexion to improve pain-free mobility/ walking Baseline:  see above Goal status: INITIAL   LONG TERM GOALS: Target date: 09/15/22  Pt. Will increase FOTO to 52 to improve pain-free mobility.   Baseline: initial FOTO 38 Goal status: INITIAL  2.  Pt. Will reports <5/10 R knee pain at worst with daily walking/ household tasks.  Baseline: 10/10 pain at worst Goal status: INITIAL  3.  Pt. Will demonstrate more normalized gait pattern with initial steps after standing and during community mobility.   Baseline: R antalgic gait without use of assistive device.  Goal status: INITIAL    PLAN:  PT FREQUENCY: 2x/week  PT DURATION: 6 weeks  PLANNED INTERVENTIONS: Therapeutic exercises, Therapeutic activity, Neuromuscular re-education, Balance training, Gait training, Patient/Family education, Self Care, Joint mobilization, Aquatic Therapy, Electrical stimulation, Cryotherapy, Moist heat, and Manual therapy  PLAN FOR NEXT SESSION: Progress  HEP  Pura Spice, PT, DPT # [phone] 08/15/2022, 6:22 PM

## 2022-08-15 NOTE — Therapy (Signed)
OUTPATIENT PHYSICAL THERAPY LOWER EXTREMITY EVALUATION   Patient Name: Melvin Johnson MRN: [id number] DOB:1941/07/03, 81 y.o., male Today's Date: 08/04/2022   PT End of Session - 08/15/22 1348     Visit Number 1    Number of Visits 13    Date for PT Re-Evaluation 09/15/22    PT Start Time 0932    PT Stop Time 2694    PT Time Calculation (min) 56 min             Past Medical History:  Diagnosis Date   Arthritis    At risk for sleep apnea    STOP-BANG=4        SENT TO PCP 06-26-2014   Coronary artery disease    no cardiologist for several years , sees pcp  dr mark Sabra Heck   History of MI (myocardial infarction)    may 1999   Hyperlipidemia    Hypertension    Mild acid reflux    Neuropathy of both feet    burning sensation   Nocturia    Prostate cancer ([place]) 03/2014   Gleason 3+3=6, seed implant 07/03/14   S/P CABG x 4    Seasonal allergies    Type 2 diabetes mellitus ([place])    Wears dentures    UPPER   Wears glasses    Past Surgical History:  Procedure Laterality Date   CARDIAC CATHETERIZATION  1999  ([place])   CATARACT EXTRACTION W/PHACO Right 03/02/2021   Procedure: CATARACT EXTRACTION PHACO AND INTRAOCULAR LENS PLACEMENT ([place]) RIGHT DIABETIC 5.79 00:45.2;  Surgeon: Eulogio Bear, MD;  Location: [place];  Service: Ophthalmology;  Laterality: Right;   CATARACT EXTRACTION W/PHACO Left 03/16/2021   Procedure: CATARACT EXTRACTION PHACO AND INTRAOCULAR LENS PLACEMENT ([facility]) LEFT DIABETIC 5.90 00:41.5;  Surgeon: Eulogio Bear, MD;  Location: [place];  Service: Ophthalmology;  Laterality: Left;   CORONARY ARTERY BYPASS GRAFT  2003   dr vantright   4 VESSEL   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   RADIOACTIVE SEED IMPLANT N/A 07/03/2014   Procedure: RADIOACTIVE SEED IMPLANT;  Surgeon: Bernestine Amass, MD;  Location: [facility];  Service: Urology;  Laterality: N/A;   TONSILLECTOMY  as child   Patient Active Problem  List   Diagnosis Date Noted   Malignant neoplasm of prostate ([place]) 05/02/2014    PCP: Rusty Aus, MD   REFERRING PROVIDER: Lovell Sheehan, MD  REFERRING DIAG: History of total knee arthroplasty  THERAPY DIAG:  Hx of total knee replacement, right  Chronic pain of right knee  Muscle weakness  Gait difficulty  Rationale for Evaluation and Treatment Rehabilitation  ONSET DATE: 04/01/22  SUBJECTIVE:   SUBJECTIVE STATEMENT: Pt. S/p R TKA on 04/01/22 and received skilled PT services at home.  Pt. States he was doing well until the PT was pushing on R knee to increase flexion and pt. Experienced significant pain.  Pt. Had a recent fall while getting out car and catching foot.  Pt. Reports no injury, just soreness.  Pt. Currently c/o 10/10 R knee pain, esp. With flexion/ increase activity.    PERTINENT HISTORY: See MD notes  PAIN:  Are you having pain? Yes: NPRS scale: 10/10 Pain location: R knee joint Pain description: aching/ sharp Aggravating factors: flexion/ increase activity Relieving factors: rest  PRECAUTIONS: None  WEIGHT BEARING RESTRICTIONS: No  FALLS:  Has patient fallen in last 6 months? Yes. Number of falls 1  LIVING ENVIRONMENT: Lives with:  lives with their spouse Lives in: House/apartment Has following equipment at home: Single point cane  OCCUPATION: Retired, pts. Wife has AKA and requires use of w/c  PLOF: Independent  PATIENT GOALS: Increase R knee ROM/ LE strengthening to improve pain-free mobility.     OBJECTIVE:   DIAGNOSTIC FINDINGS: See [facility] noteds  PATIENT SURVEYS:  FOTO initial 38/ goal 73  COGNITION: Overall cognitive status: Within functional limits for tasks assessed     SENSATION: WFL  EDEMA:  Circumferential: L/R knee joint line:  39.5/41 cm./ mid-gastroc  36/36 cm.).    POSTURE: rounded shoulders and forward head  PALPATION: R knee joint line tenderness.  Moderate R knee/ lower leg pitting edema as compared to  L.    LOWER EXTREMITY ROM:  L/R knee: flexion (130 deg./ 107 deg.), extension (0 deg./ -2 deg.).   R knee flexion PROM: 117 deg. (15/10 pain reported).    LOWER EXTREMITY MMT:  MMT Right eval Left eval  Hip flexion 4/5 4/5  Hip extension 4/5 4/5  Hip abduction 4/5 4/5  Hip adduction    Hip internal rotation    Hip external rotation    Knee flexion 5/5 5/5  Knee extension 5/5 5/5  Ankle dorsiflexion    Ankle plantarflexion    Ankle inversion    Ankle eversion     (Blank rows = not tested)  LOWER EXTREMITY SPECIAL TESTS:  N/T  FUNCTIONAL TESTS:  5 times sit to stand: TBD SLS: <8 sec.   GAIT: Distance walked: in clinic Assistive device utilized: None Level of assistance: Complete Independence Comments: Slight R antalgic gait with initial steps after standing and increase distance walked.     TODAY'S TREATMENT    DATE: 08/04/22  Nustep L4 10 min. B UE/LE.  Discussed HEP and recip. Stair climbing with use of 1 handrail.    PATIENT EDUCATION:  Education details: See HEP Person educated: Patient Education method: Explanation, Demonstration, and Handouts Education comprehension: verbalized understanding and returned demonstration  HOME EXERCISE PROGRAM: Reviewed HHPT HEP.    ASSESSMENT:  CLINICAL IMPRESSION: Patient is a pleasant 81 y.o. male who was seen today for physical therapy evaluation and treatment for R TKA/ joint stiffness/ pain.   Pt. Presents with R knee joint line swelling and pitting edema.  R knee AROM: -2 to 107 deg. And PROM 0 to 117 deg. With marked increase in knee pain.  Pt. Will benefit from skilled PT services to increase R knee AROM to improve pain-free functional mobility.    OBJECTIVE IMPAIRMENTS: Abnormal gait, decreased balance, decreased coordination, decreased mobility, difficulty walking, decreased ROM, decreased strength, decreased safety awareness, hypomobility, impaired flexibility, improper body mechanics, and pain.   ACTIVITY  LIMITATIONS: carrying, lifting, bending, sitting, standing, squatting, stairs, transfers, bed mobility, and locomotion level  PARTICIPATION LIMITATIONS: community activity  PERSONAL FACTORS: Age, Fitness, and Social background are also affecting patient's functional outcome.   REHAB POTENTIAL: Good  CLINICAL DECISION MAKING: Stable/uncomplicated  EVALUATION COMPLEXITY: Low   GOALS: Goals reviewed with patient? Yes  SHORT TERM GOALS: Target date: 08/25/22 Pt. Independent with HEP to increase R knee AROM to 115 deg. Flexion to improve pain-free mobility/ walking Baseline:  see above Goal status: INITIAL   LONG TERM GOALS: Target date: 09/15/22  Pt. Will increase FOTO to 52 to improve pain-free mobility.   Baseline: initial FOTO 38 Goal status: INITIAL  2.  Pt. Will reports <5/10 R knee pain at worst with daily walking/ household tasks.  Baseline: 10/10 pain  at worst Goal status: INITIAL  3.  Pt. Will demonstrate more normalized gait pattern with initial steps after standing and during community mobility.   Baseline: R antalgic gait without use of assistive device.  Goal status: INITIAL    PLAN:  PT FREQUENCY: 2x/week  PT DURATION: 6 weeks  PLANNED INTERVENTIONS: Therapeutic exercises, Therapeutic activity, Neuromuscular re-education, Balance training, Gait training, Patient/Family education, Self Care, Joint mobilization, Aquatic Therapy, Electrical stimulation, Cryotherapy, Moist heat, and Manual therapy  PLAN FOR NEXT SESSION: Progress HEP  Pura Spice, PT, DPT # [phone] 08/15/2022, 1:51 PM

## 2022-08-17 ENCOUNTER — Ambulatory Visit: Payer: Medicare HMO | Admitting: Physical Therapy

## 2022-08-17 DIAGNOSIS — M6281 Muscle weakness (generalized): Secondary | ICD-10-CM

## 2022-08-17 DIAGNOSIS — Z96651 Presence of right artificial knee joint: Secondary | ICD-10-CM | POA: Diagnosis not present

## 2022-08-17 DIAGNOSIS — G8929 Other chronic pain: Secondary | ICD-10-CM

## 2022-08-17 DIAGNOSIS — R269 Unspecified abnormalities of gait and mobility: Secondary | ICD-10-CM

## 2022-08-17 NOTE — Therapy (Signed)
OUTPATIENT PHYSICAL THERAPY LOWER EXTREMITY TREATMENT   Patient Name: Melvin Johnson MRN: [id number] DOB:24-Jan-1941, 81 y.o., male Today's Date: 08/17/2022   PT End of Session - 08/17/22 2055     Visit Number 3    Number of Visits 13    Date for PT Re-Evaluation 09/15/22    PT Start Time 1026    PT Stop Time 1111    PT Time Calculation (min) 45 min             Past Medical History:  Diagnosis Date   Arthritis    At risk for sleep apnea    STOP-BANG=4        SENT TO PCP 06-26-2014   Coronary artery disease    no cardiologist for several years , sees pcp  dr mark Sabra Heck   History of MI (myocardial infarction)    may 1999   Hyperlipidemia    Hypertension    Mild acid reflux    Neuropathy of both feet    burning sensation   Nocturia    Prostate cancer ([place]) 03/2014   Gleason 3+3=6, seed implant 07/03/14   S/P CABG x 4    Seasonal allergies    Type 2 diabetes mellitus ([place])    Wears dentures    UPPER   Wears glasses    Past Surgical History:  Procedure Laterality Date   CARDIAC CATHETERIZATION  1999  ([place])   CATARACT EXTRACTION W/PHACO Right 03/02/2021   Procedure: CATARACT EXTRACTION PHACO AND INTRAOCULAR LENS PLACEMENT ([place]) RIGHT DIABETIC 5.79 00:45.2;  Surgeon: Eulogio Bear, MD;  Location: [place];  Service: Ophthalmology;  Laterality: Right;   CATARACT EXTRACTION W/PHACO Left 03/16/2021   Procedure: CATARACT EXTRACTION PHACO AND INTRAOCULAR LENS PLACEMENT ([facility]) LEFT DIABETIC 5.90 00:41.5;  Surgeon: Eulogio Bear, MD;  Location: [place];  Service: Ophthalmology;  Laterality: Left;   CORONARY ARTERY BYPASS GRAFT  2003   dr vantright   4 VESSEL   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   RADIOACTIVE SEED IMPLANT N/A 07/03/2014   Procedure: RADIOACTIVE SEED IMPLANT;  Surgeon: Bernestine Amass, MD;  Location: [facility];  Service: Urology;  Laterality: N/A;   TONSILLECTOMY  as child   Patient Active Problem  List   Diagnosis Date Noted   Malignant neoplasm of prostate ([place]) 05/02/2014    PCP: Rusty Aus, MD   REFERRING PROVIDER: Lovell Sheehan, MD  REFERRING DIAG: History of total knee arthroplasty  THERAPY DIAG:  Hx of total knee replacement, right  Chronic pain of right knee  Muscle weakness  Gait difficulty  Rationale for Evaluation and Treatment Rehabilitation  ONSET DATE: 04/01/22  SUBJECTIVE:   SUBJECTIVE STATEMENT:    EVALUATION Pt. S/p R TKA on 04/01/22 and received skilled PT services at home.  Pt. States he was doing well until the PT was pushing on R knee to increase flexion and pt. Experienced significant pain.  Pt. Had a recent fall while getting out car and catching foot.  Pt. Reports no injury, just soreness.  Pt. Currently c/o 10/10 R knee pain, esp. With flexion/ increase activity.    PERTINENT HISTORY: See MD notes  PAIN:  Are you having pain? Yes: NPRS scale: 10/10 Pain location: R knee joint Pain description: aching/ sharp Aggravating factors: flexion/ increase activity Relieving factors: rest  PRECAUTIONS: None  WEIGHT BEARING RESTRICTIONS: No  FALLS:  Has patient fallen in last 6 months? Yes. Number of falls 1  LIVING ENVIRONMENT: Lives with: lives with their spouse Lives in: House/apartment Has following equipment at home: Single point cane  OCCUPATION: Retired, pts. Wife has AKA and requires use of w/c  PLOF: Independent  PATIENT GOALS: Increase R knee ROM/ LE strengthening to improve pain-free mobility.     OBJECTIVE:   DIAGNOSTIC FINDINGS: See [facility] noteds  PATIENT SURVEYS:  FOTO initial 38/ goal 46  COGNITION: Overall cognitive status: Within functional limits for tasks assessed     SENSATION: WFL  EDEMA:  Circumferential: L/R knee joint line:  39.5/41 cm./ mid-gastroc  36/36 cm.).    POSTURE: rounded shoulders and forward head  PALPATION: R knee joint line tenderness.  Moderate R knee/ lower leg pitting edema  as compared to L.    LOWER EXTREMITY ROM:  L/R knee: flexion (130 deg./ 107 deg.), extension (0 deg./ -2 deg.).   R knee flexion PROM: 117 deg. (15/10 pain reported).    LOWER EXTREMITY MMT:  MMT Right eval Left eval  Hip flexion 4/5 4/5  Hip extension 4/5 4/5  Hip abduction 4/5 4/5  Hip adduction    Hip internal rotation    Hip external rotation    Knee flexion 5/5 5/5  Knee extension 5/5 5/5  Ankle dorsiflexion    Ankle plantarflexion    Ankle inversion    Ankle eversion     (Blank rows = not tested)  LOWER EXTREMITY SPECIAL TESTS:  N/T  FUNCTIONAL TESTS:  5 times sit to stand: TBD SLS: <8 sec.   GAIT: Distance walked: in clinic Assistive device utilized: None Level of assistance: Complete Independence Comments: Slight R antalgic gait with initial steps after standing and increase distance walked.     TODAY'S TREATMENT      DATE: 08/17/22  There.ex.:  Scifit L6 10 min. B UE/LE.    Walking in hallway with gait assessment.  Good recip. Pattern/ heel strike and arm swing.    Supine SLR 10x2.    Standing hamstring stretches/ lunges at stairs 3x on L/R.    Manual tx.:  Supine R hamstring stretches 5x with static holds.  Supine knee flexion AA/PROM with holds (as tolerated)- pain limited  STM to L distal quad/ hamstring.  Patellar mobs (all planes)- grade III 3x 20 sec. Each.   Supine proximal tibia AP mobs. (Grade II-III)- 3x 20 sec.  R knee flexion 122 deg. After manual tx.       PATIENT EDUCATION:  Education details: See HEP Person educated: Patient Education method: Explanation, Demonstration, and Handouts Education comprehension: verbalized understanding and returned demonstration  HOME EXERCISE PROGRAM: Reviewed HHPT HEP.    ASSESSMENT:  CLINICAL IMPRESSION:  Pt. Has limited inferior/ superior patellar mobility but increase R knee flexion to 122 deg. After manual tx./ stretches.  Pt. Will continue with daily stretches/ HEP and use of ice to  increase R knee ROM/ pain-free mobility.  Pt. Will benefit from skilled PT services to increase R knee AROM to improve pain-free functional mobility.    OBJECTIVE IMPAIRMENTS: Abnormal gait, decreased balance, decreased coordination, decreased mobility, difficulty walking, decreased ROM, decreased strength, decreased safety awareness, hypomobility, impaired flexibility, improper body mechanics, and pain.   ACTIVITY LIMITATIONS: carrying, lifting, bending, sitting, standing, squatting, stairs, transfers, bed mobility, and locomotion level  PARTICIPATION LIMITATIONS: community activity  PERSONAL FACTORS: Age, Fitness, and Social background are also affecting patient's functional outcome.   REHAB POTENTIAL: Good  CLINICAL DECISION MAKING: Stable/uncomplicated  EVALUATION COMPLEXITY: Low   GOALS: Goals reviewed with patient?  Yes  SHORT TERM GOALS: Target date: 08/25/22 Pt. Independent with HEP to increase R knee AROM to 115 deg. Flexion to improve pain-free mobility/ walking Baseline:  see above Goal status: INITIAL   LONG TERM GOALS: Target date: 09/15/22  Pt. Will increase FOTO to 52 to improve pain-free mobility.   Baseline: initial FOTO 38 Goal status: INITIAL  2.  Pt. Will reports <5/10 R knee pain at worst with daily walking/ household tasks.  Baseline: 10/10 pain at worst Goal status: INITIAL  3.  Pt. Will demonstrate more normalized gait pattern with initial steps after standing and during community mobility.   Baseline: R antalgic gait without use of assistive device.  Goal status: INITIAL    PLAN:  PT FREQUENCY: 2x/week  PT DURATION: 6 weeks  PLANNED INTERVENTIONS: Therapeutic exercises, Therapeutic activity, Neuromuscular re-education, Balance training, Gait training, Patient/Family education, Self Care, Joint mobilization, Aquatic Therapy, Electrical stimulation, Cryotherapy, Moist heat, and Manual therapy  PLAN FOR NEXT SESSION: Progress HEP  Pura Spice, PT, DPT # [id number] 08/17/2022, 8:55 PM

## 2022-08-31 ENCOUNTER — Ambulatory Visit: Payer: Medicare HMO | Attending: Orthopedic Surgery | Admitting: Physical Therapy

## 2022-08-31 DIAGNOSIS — Z96651 Presence of right artificial knee joint: Secondary | ICD-10-CM | POA: Insufficient documentation

## 2022-08-31 DIAGNOSIS — R269 Unspecified abnormalities of gait and mobility: Secondary | ICD-10-CM | POA: Diagnosis present

## 2022-08-31 DIAGNOSIS — M6281 Muscle weakness (generalized): Secondary | ICD-10-CM | POA: Diagnosis present

## 2022-08-31 DIAGNOSIS — M25561 Pain in right knee: Secondary | ICD-10-CM | POA: Insufficient documentation

## 2022-08-31 DIAGNOSIS — G8929 Other chronic pain: Secondary | ICD-10-CM | POA: Diagnosis present

## 2022-08-31 NOTE — Therapy (Signed)
OUTPATIENT PHYSICAL THERAPY LOWER EXTREMITY TREATMENT   Patient Name: Melvin Johnson MRN: [id number] DOB:04/22/1941, 81 y.o., male Today's Date: 08/31/2022   PT End of Session - 08/31/22 1701     Visit Number 4    Number of Visits 13    Date for PT Re-Evaluation 09/15/22    PT Start Time 8177    PT Stop Time 1165    PT Time Calculation (min) 36 min             Past Medical History:  Diagnosis Date   Arthritis    At risk for sleep apnea    STOP-BANG=4        SENT TO PCP 06-26-2014   Coronary artery disease    no cardiologist for several years , sees pcp  dr mark Sabra Heck   History of MI (myocardial infarction)    may 1999   Hyperlipidemia    Hypertension    Mild acid reflux    Neuropathy of both feet    burning sensation   Nocturia    Prostate cancer ([place]) 03/2014   Gleason 3+3=6, seed implant 07/03/14   S/P CABG x 4    Seasonal allergies    Type 2 diabetes mellitus ([place])    Wears dentures    UPPER   Wears glasses    Past Surgical History:  Procedure Laterality Date   CARDIAC CATHETERIZATION  1999  ([place])   CATARACT EXTRACTION W/PHACO Right 03/02/2021   Procedure: CATARACT EXTRACTION PHACO AND INTRAOCULAR LENS PLACEMENT ([place]) RIGHT DIABETIC 5.79 00:45.2;  Surgeon: Eulogio Bear, MD;  Location: [place];  Service: Ophthalmology;  Laterality: Right;   CATARACT EXTRACTION W/PHACO Left 03/16/2021   Procedure: CATARACT EXTRACTION PHACO AND INTRAOCULAR LENS PLACEMENT ([facility]) LEFT DIABETIC 5.90 00:41.5;  Surgeon: Eulogio Bear, MD;  Location: [place];  Service: Ophthalmology;  Laterality: Left;   CORONARY ARTERY BYPASS GRAFT  2003   dr vantright   4 VESSEL   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   RADIOACTIVE SEED IMPLANT N/A 07/03/2014   Procedure: RADIOACTIVE SEED IMPLANT;  Surgeon: Bernestine Amass, MD;  Location: [facility];  Service: Urology;  Laterality: N/A;   TONSILLECTOMY  as child   Patient Active Problem  List   Diagnosis Date Noted   Malignant neoplasm of prostate ([place]) 05/02/2014    PCP: Rusty Aus, MD   REFERRING PROVIDER: Lovell Sheehan, MD  REFERRING DIAG: History of total knee arthroplasty  THERAPY DIAG:  Hx of total knee replacement, right  Chronic pain of right knee  Muscle weakness  Gait difficulty  Rationale for Evaluation and Treatment Rehabilitation  ONSET DATE: 04/01/22  SUBJECTIVE:   SUBJECTIVE STATEMENT:    EVALUATION Pt. S/p R TKA on 04/01/22 and received skilled PT services at home.  Pt. States he was doing well until the PT was pushing on R knee to increase flexion and pt. Experienced significant pain.  Pt. Had a recent fall while getting out car and catching foot.  Pt. Reports no injury, just soreness.  Pt. Currently c/o 10/10 R knee pain, esp. With flexion/ increase activity.    PERTINENT HISTORY: See MD notes  PAIN:  Are you having pain? Yes: NPRS scale: 10/10 Pain location: R knee joint Pain description: aching/ sharp Aggravating factors: flexion/ increase activity Relieving factors: rest  PRECAUTIONS: None  WEIGHT BEARING RESTRICTIONS: No  FALLS:  Has patient fallen in last 6 months? Yes. Number of falls 1  LIVING ENVIRONMENT: Lives with: lives with their spouse Lives in: House/apartment Has following equipment at home: Single point cane  OCCUPATION: Retired, pts. Wife has AKA and requires use of w/c  PLOF: Independent  PATIENT GOALS: Increase R knee ROM/ LE strengthening to improve pain-free mobility.     OBJECTIVE:   DIAGNOSTIC FINDINGS: See [facility] noteds  PATIENT SURVEYS:  FOTO initial 38/ goal 38  COGNITION: Overall cognitive status: Within functional limits for tasks assessed     SENSATION: WFL  EDEMA:  Circumferential: L/R knee joint line:  39.5/41 cm./ mid-gastroc  36/36 cm.).    POSTURE: rounded shoulders and forward head  PALPATION: R knee joint line tenderness.  Moderate R knee/ lower leg pitting edema  as compared to L.    LOWER EXTREMITY ROM:  L/R knee: flexion (130 deg./ 107 deg.), extension (0 deg./ -2 deg.).   R knee flexion PROM: 117 deg. (15/10 pain reported).    LOWER EXTREMITY MMT:  MMT Right eval Left eval  Hip flexion 4/5 4/5  Hip extension 4/5 4/5  Hip abduction 4/5 4/5  Hip adduction    Hip internal rotation    Hip external rotation    Knee flexion 5/5 5/5  Knee extension 5/5 5/5  Ankle dorsiflexion    Ankle plantarflexion    Ankle inversion    Ankle eversion     (Blank rows = not tested)  LOWER EXTREMITY SPECIAL TESTS:  N/T  FUNCTIONAL TESTS:  5 times sit to stand: TBD SLS: <8 sec.   GAIT: Distance walked: in clinic Assistive device utilized: None Level of assistance: Complete Independence Comments: Slight R antalgic gait with initial steps after standing and increase distance walked.     TODAY'S TREATMENT      DATE: 08/31/22  There.ex.:  Scifit L6 10 min. B UE/LE.  Warm-up/ discussed daily activity/ walking.    Supine SLR 10x2.  No pain or issues.   Supine R hip/knee manual isometrics 5x (mod-max. Resistance).    Reassessment of B hip strength grossly 4/5 MMT (flexion/ abduction/ adduction).  B knee strength grossly 5/5 MMT.    Manual tx.:  Supine R hamstring stretches 5x with static holds.  Supine knee flexion AA/PROM with holds (as tolerated)- pain limited  STM to distal R quad/ hamstring.  Patellar mobs (all planes)- grade III 3x 20 sec. Each.   Supine proximal tibia AP mobs. (Grade II-III)- 3x 20 sec.  R knee flexion AROM 121 deg. After manual tx.    Ice to R knee in seated position with stool at end of tx. While waiting on wife to complete PT.     PATIENT EDUCATION:  Education details: See HEP Person educated: Patient Education method: Explanation, Demonstration, and Handouts Education comprehension: verbalized understanding and returned demonstration  HOME EXERCISE PROGRAM: Reviewed HHPT HEP.    ASSESSMENT:  CLINICAL  IMPRESSION:  Pt. Presents with 121 deg. R knee AROM in a pain tolerable range at end of tx. Session.  Excellent B quad/ hamstring strength but remains limited with hip strengthening (4/5 MMT).  Pt. Will continue with daily stretches/ HEP and use of ice to increase R knee ROM/ pain-free mobility.  Pt. Will benefit from skilled PT services to increase R knee AROM to improve pain-free functional mobility.    OBJECTIVE IMPAIRMENTS: Abnormal gait, decreased balance, decreased coordination, decreased mobility, difficulty walking, decreased ROM, decreased strength, decreased safety awareness, hypomobility, impaired flexibility, improper body mechanics, and pain.   ACTIVITY LIMITATIONS: carrying, lifting, bending, sitting, standing, squatting,  stairs, transfers, bed mobility, and locomotion level  PARTICIPATION LIMITATIONS: community activity  PERSONAL FACTORS: Age, Fitness, and Social background are also affecting patient's functional outcome.   REHAB POTENTIAL: Good  CLINICAL DECISION MAKING: Stable/uncomplicated  EVALUATION COMPLEXITY: Low   GOALS: Goals reviewed with patient? Yes  SHORT TERM GOALS: Target date: 08/25/22 Pt. Independent with HEP to increase R knee AROM to 115 deg. Flexion to improve pain-free mobility/ walking Baseline:  see above Goal status: Goal met   LONG TERM GOALS: Target date: 09/15/22  Pt. Will increase FOTO to 52 to improve pain-free mobility.   Baseline: initial FOTO 38 Goal status: INITIAL  2.  Pt. Will reports <5/10 R knee pain at worst with daily walking/ household tasks.  Baseline: 10/10 pain at worst Goal status: INITIAL  3.  Pt. Will demonstrate more normalized gait pattern with initial steps after standing and during community mobility.   Baseline: R antalgic gait without use of assistive device.  Goal status: INITIAL    PLAN:  PT FREQUENCY: 2x/week  PT DURATION: 6 weeks  PLANNED INTERVENTIONS: Therapeutic exercises, Therapeutic activity,  Neuromuscular re-education, Balance training, Gait training, Patient/Family education, Self Care, Joint mobilization, Aquatic Therapy, Electrical stimulation, Cryotherapy, Moist heat, and Manual therapy  PLAN FOR NEXT SESSION: Progress HEP  Pura Spice, PT, DPT # [phone] 09/01/2022, 7:47 AM

## 2022-09-01 ENCOUNTER — Encounter: Payer: Self-pay | Admitting: Physical Therapy

## 2022-09-02 ENCOUNTER — Encounter: Payer: Medicare HMO | Admitting: Physical Therapy

## 2022-09-03 ENCOUNTER — Ambulatory Visit: Payer: Medicare HMO | Admitting: Physical Therapy

## 2022-09-03 DIAGNOSIS — G8929 Other chronic pain: Secondary | ICD-10-CM

## 2022-09-03 DIAGNOSIS — R269 Unspecified abnormalities of gait and mobility: Secondary | ICD-10-CM

## 2022-09-03 DIAGNOSIS — Z96651 Presence of right artificial knee joint: Secondary | ICD-10-CM | POA: Diagnosis not present

## 2022-09-03 DIAGNOSIS — M6281 Muscle weakness (generalized): Secondary | ICD-10-CM

## 2022-09-06 ENCOUNTER — Ambulatory Visit: Payer: Medicare HMO | Admitting: Physical Therapy

## 2022-09-09 ENCOUNTER — Ambulatory Visit: Payer: Medicare HMO | Admitting: Physical Therapy

## 2022-09-09 DIAGNOSIS — Z96651 Presence of right artificial knee joint: Secondary | ICD-10-CM

## 2022-09-09 DIAGNOSIS — R269 Unspecified abnormalities of gait and mobility: Secondary | ICD-10-CM

## 2022-09-09 DIAGNOSIS — M6281 Muscle weakness (generalized): Secondary | ICD-10-CM

## 2022-09-09 DIAGNOSIS — G8929 Other chronic pain: Secondary | ICD-10-CM

## 2022-09-11 NOTE — Therapy (Signed)
OUTPATIENT PHYSICAL THERAPY LOWER EXTREMITY TREATMENT   Patient Name: Melvin Johnson MRN: [id number] DOB:1941-04-17, 81 y.o., male Today's Date: 09/09/2022   PT End of Session - 09/11/22 1750     Visit Number 6    Number of Visits 13    Date for PT Re-Evaluation 09/15/22    PT Start Time 1026    PT Stop Time 1114    PT Time Calculation (min) 48 min             Past Medical History:  Diagnosis Date   Arthritis    At risk for sleep apnea    STOP-BANG=4        SENT TO PCP 06-26-2014   Coronary artery disease    no cardiologist for several years , sees pcp  dr mark Sabra Heck   History of MI (myocardial infarction)    may 1999   Hyperlipidemia    Hypertension    Mild acid reflux    Neuropathy of both feet    burning sensation   Nocturia    Prostate cancer ([place]) 03/2014   Gleason 3+3=6, seed implant 07/03/14   S/P CABG x 4    Seasonal allergies    Type 2 diabetes mellitus ([place])    Wears dentures    UPPER   Wears glasses    Past Surgical History:  Procedure Laterality Date   CARDIAC CATHETERIZATION  1999  ([place])   CATARACT EXTRACTION W/PHACO Right 03/02/2021   Procedure: CATARACT EXTRACTION PHACO AND INTRAOCULAR LENS PLACEMENT ([place]) RIGHT DIABETIC 5.79 00:45.2;  Surgeon: Eulogio Bear, MD;  Location: [facility];  Service: Ophthalmology;  Laterality: Right;   CATARACT EXTRACTION W/PHACO Left 03/16/2021   Procedure: CATARACT EXTRACTION PHACO AND INTRAOCULAR LENS PLACEMENT ([facility]) LEFT DIABETIC 5.90 00:41.5;  Surgeon: Eulogio Bear, MD;  Location: [facility];  Service: Ophthalmology;  Laterality: Left;   CORONARY ARTERY BYPASS GRAFT  2003   dr vantright   4 VESSEL   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   RADIOACTIVE SEED IMPLANT N/A 07/03/2014   Procedure: RADIOACTIVE SEED IMPLANT;  Surgeon: Bernestine Amass, MD;  Location: [facility];  Service: Urology;  Laterality: N/A;   TONSILLECTOMY  as child   Patient Active Problem  List   Diagnosis Date Noted   Malignant neoplasm of prostate ([place]) 05/02/2014    PCP: Rusty Aus, MD   REFERRING PROVIDER: Lovell Sheehan, MD  REFERRING DIAG: History of total knee arthroplasty  THERAPY DIAG:  Hx of total knee replacement, right  Chronic pain of right knee  Muscle weakness  Gait difficulty  Rationale for Evaluation and Treatment Rehabilitation  ONSET DATE: 04/01/22  SUBJECTIVE:   SUBJECTIVE STATEMENT:    EVALUATION Pt. S/p R TKA on 04/01/22 and received skilled PT services at home.  Pt. States he was doing well until the PT was pushing on R knee to increase flexion and pt. Experienced significant pain.  Pt. Had a recent fall while getting out car and catching foot.  Pt. Reports no injury, just soreness.  Pt. Currently c/o 10/10 R knee pain, esp. With flexion/ increase activity.    PERTINENT HISTORY: See MD notes  PAIN:  Are you having pain? Yes: NPRS scale: 10/10 Pain location: R knee joint Pain description: aching/ sharp Aggravating factors: flexion/ increase activity Relieving factors: rest  PRECAUTIONS: None  WEIGHT BEARING RESTRICTIONS: No  FALLS:  Has patient fallen in last 6 months? Yes. Number of falls 1  LIVING ENVIRONMENT: Lives with: lives with their spouse Lives in: House/apartment Has following equipment at home: Single point cane  OCCUPATION: Retired, pts. Wife has AKA and requires use of w/c  PLOF: Independent  PATIENT GOALS: Increase R knee ROM/ LE strengthening to improve pain-free mobility.     OBJECTIVE:   DIAGNOSTIC FINDINGS: See [facility] noteds  PATIENT SURVEYS:  FOTO initial 38/ goal 36  COGNITION: Overall cognitive status: Within functional limits for tasks assessed     SENSATION: WFL  EDEMA:  Circumferential: L/R knee joint line:  39.5/41 cm./ mid-gastroc  36/36 cm.).    POSTURE: rounded shoulders and forward head  PALPATION: R knee joint line tenderness.  Moderate R knee/ lower leg pitting edema  as compared to L.    LOWER EXTREMITY ROM:  L/R knee: flexion (130 deg./ 107 deg.), extension (0 deg./ -2 deg.).   R knee flexion PROM: 117 deg. (15/10 pain reported).    LOWER EXTREMITY MMT:  MMT Right eval Left eval  Hip flexion 4/5 4/5  Hip extension 4/5 4/5  Hip abduction 4/5 4/5  Hip adduction    Hip internal rotation    Hip external rotation    Knee flexion 5/5 5/5  Knee extension 5/5 5/5  Ankle dorsiflexion    Ankle plantarflexion    Ankle inversion    Ankle eversion     (Blank rows = not tested)  LOWER EXTREMITY SPECIAL TESTS:  N/T  FUNCTIONAL TESTS:  5 times sit to stand: TBD SLS: <8 sec.   GAIT: Distance walked: in clinic Assistive device utilized: None Level of assistance: Complete Independence Comments: Slight R antalgic gait with initial steps after standing and increase distance walked.    11/7:  Reassessment of B hip strength grossly 4/5 MMT (flexion/ abduction/ adduction).  B knee strength grossly 5/5 MMT.     TODAY'S TREATMENT      DATE: 09/09/22  Subjective:  Pt. States he missed last appt. Because he thought it was on Tuesday, not Monday.  Pt. Reports no new complaints.  No subjective pain score given prior to tx. Session.     There.ex.:  Scifit L6 10 min. B UE/LE.  Warm-up/ discussed daily activity/ walking.    TG knee flexion 20x (pain tolerable range)- cuing to focus on full knee extension/ quad activation.     Walking in clinic with consistent step pattern/ heel strike.    Supine R hip/knee manual isometrics 5x (mod-max. Resistance).    Manual tx.:  Supine R hamstring/ hip stretches 5x each with static holds.  Supine knee flexion AA/PROM with holds (as tolerated)- pain limited  STM to distal R quad/ hamstring.  Patellar mobs (all planes)- grade III 3x 20 sec. Each.   Supine proximal tibia AP mobs. (Grade II-III)- 3x 20 sec.    Ice to R knee in supine position after tx. Session.       PATIENT EDUCATION:  Education details: See  HEP Person educated: Patient Education method: Explanation, Demonstration, and Handouts Education comprehension: verbalized understanding and returned demonstration  HOME EXERCISE PROGRAM: Reviewed HHPT HEP.    ASSESSMENT:  CLINICAL IMPRESSION:  Excellent B quad/ hamstring strength but remains limited with hip strengthening (4/5 MMT).  Good hip flexion/ step pattern while walking in PT clinic.  Pt. Able to demonstrate ability to lift wife's w/c to take out/in of trunk of car.  Pt. Will continue with daily stretches/ HEP and use of ice to increase R knee ROM/ pain-free mobility.  Pt. Will benefit  from skilled PT services to increase R knee AROM to improve pain-free functional mobility.    OBJECTIVE IMPAIRMENTS: Abnormal gait, decreased balance, decreased coordination, decreased mobility, difficulty walking, decreased ROM, decreased strength, decreased safety awareness, hypomobility, impaired flexibility, improper body mechanics, and pain.   ACTIVITY LIMITATIONS: carrying, lifting, bending, sitting, standing, squatting, stairs, transfers, bed mobility, and locomotion level  PARTICIPATION LIMITATIONS: community activity  PERSONAL FACTORS: Age, Fitness, and Social background are also affecting patient's functional outcome.   REHAB POTENTIAL: Good  CLINICAL DECISION MAKING: Stable/uncomplicated  EVALUATION COMPLEXITY: Low   GOALS: Goals reviewed with patient? Yes  SHORT TERM GOALS: Target date: 08/25/22 Pt. Independent with HEP to increase R knee AROM to 115 deg. Flexion to improve pain-free mobility/ walking Baseline:  see above Goal status: Goal met   LONG TERM GOALS: Target date: 09/15/22  Pt. Will increase FOTO to 52 to improve pain-free mobility.   Baseline: initial FOTO 38 Goal status: INITIAL  2.  Pt. Will reports <5/10 R knee pain at worst with daily walking/ household tasks.  Baseline: 10/10 pain at worst Goal status: INITIAL  3.  Pt. Will demonstrate more  normalized gait pattern with initial steps after standing and during community mobility.   Baseline: R antalgic gait without use of assistive device.  Goal status: INITIAL    PLAN:  PT FREQUENCY: 2x/week  PT DURATION: 6 weeks  PLANNED INTERVENTIONS: Therapeutic exercises, Therapeutic activity, Neuromuscular re-education, Balance training, Gait training, Patient/Family education, Self Care, Joint mobilization, Aquatic Therapy, Electrical stimulation, Cryotherapy, Moist heat, and Manual therapy  PLAN FOR NEXT SESSION: CHECK GOALS  Pura Spice, PT, DPT # [phone] 09/11/2022, 5:52 PM

## 2022-09-11 NOTE — Therapy (Signed)
OUTPATIENT PHYSICAL THERAPY LOWER EXTREMITY TREATMENT   Patient Name: Melvin Johnson MRN: [id number] DOB:[birth year], 81 y.o., male Today's Date: 09/03/2022   PT End of Session - 09/11/22 1328     Visit Number 5    Number of Visits 13    Date for PT Re-Evaluation 09/15/22    PT Start Time 4665    PT Stop Time 1115    PT Time Calculation (min) 46 min             Past Medical History:  Diagnosis Date   Arthritis    At risk for sleep apnea    STOP-BANG=4        SENT TO PCP 06-26-2014   Coronary artery disease    no cardiologist for several years , sees pcp  dr mark Sabra Heck   History of MI (myocardial infarction)    may 1999   Hyperlipidemia    Hypertension    Mild acid reflux    Neuropathy of both feet    burning sensation   Nocturia    Prostate cancer ([place]) 03/2014   Gleason 3+3=6, seed implant 07/03/14   S/P CABG x 4    Seasonal allergies    Type 2 diabetes mellitus ([place])    Wears dentures    UPPER   Wears glasses    Past Surgical History:  Procedure Laterality Date   CARDIAC CATHETERIZATION  1999  ([place])   CATARACT EXTRACTION W/PHACO Right 03/02/2021   Procedure: CATARACT EXTRACTION PHACO AND INTRAOCULAR LENS PLACEMENT ([place]) RIGHT DIABETIC 5.79 00:45.2;  Surgeon: Eulogio Bear, MD;  Location: [facility];  Service: Ophthalmology;  Laterality: Right;   CATARACT EXTRACTION W/PHACO Left 03/16/2021   Procedure: CATARACT EXTRACTION PHACO AND INTRAOCULAR LENS PLACEMENT ([facility]) LEFT DIABETIC 5.90 00:41.5;  Surgeon: Eulogio Bear, MD;  Location: [facility];  Service: Ophthalmology;  Laterality: Left;   CORONARY ARTERY BYPASS GRAFT  2003   dr vantright   4 VESSEL   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   RADIOACTIVE SEED IMPLANT N/A 07/03/2014   Procedure: RADIOACTIVE SEED IMPLANT;  Surgeon: Bernestine Amass, MD;  Location: [facility];  Service: Urology;  Laterality: N/A;   TONSILLECTOMY  as child   Patient Active Problem  List   Diagnosis Date Noted   Malignant neoplasm of prostate ([place]) 05/02/2014    PCP: Rusty Aus, MD   REFERRING PROVIDER: Lovell Sheehan, MD  REFERRING DIAG: History of total knee arthroplasty  THERAPY DIAG:  Hx of total knee replacement, right  Chronic pain of right knee  Muscle weakness  Gait difficulty  Rationale for Evaluation and Treatment Rehabilitation  ONSET DATE: 04/01/22  SUBJECTIVE:   SUBJECTIVE STATEMENT:    EVALUATION Pt. S/p R TKA on 04/01/22 and received skilled PT services at home.  Pt. States he was doing well until the PT was pushing on R knee to increase flexion and pt. Experienced significant pain.  Pt. Had a recent fall while getting out car and catching foot.  Pt. Reports no injury, just soreness.  Pt. Currently c/o 10/10 R knee pain, esp. With flexion/ increase activity.    PERTINENT HISTORY: See MD notes  PAIN:  Are you having pain? Yes: NPRS scale: 10/10 Pain location: R knee joint Pain description: aching/ sharp Aggravating factors: flexion/ increase activity Relieving factors: rest  PRECAUTIONS: None  WEIGHT BEARING RESTRICTIONS: No  FALLS:  Has patient fallen in last 6 months? Yes. Number of falls 1  LIVING ENVIRONMENT: Lives with: lives with their spouse Lives in: House/apartment Has following equipment at home: Single point cane  OCCUPATION: Retired, pts. Wife has AKA and requires use of w/c  PLOF: Independent  PATIENT GOALS: Increase R knee ROM/ LE strengthening to improve pain-free mobility.     OBJECTIVE:   DIAGNOSTIC FINDINGS: See [facility] noteds  PATIENT SURVEYS:  FOTO initial 38/ goal 5  COGNITION: Overall cognitive status: Within functional limits for tasks assessed     SENSATION: WFL  EDEMA:  Circumferential: L/R knee joint line:  39.5/41 cm./ mid-gastroc  36/36 cm.).    POSTURE: rounded shoulders and forward head  PALPATION: R knee joint line tenderness.  Moderate R knee/ lower leg pitting edema  as compared to L.    LOWER EXTREMITY ROM:  L/R knee: flexion (130 deg./ 107 deg.), extension (0 deg./ -2 deg.).   R knee flexion PROM: 117 deg. (15/10 pain reported).    LOWER EXTREMITY MMT:  MMT Right eval Left eval  Hip flexion 4/5 4/5  Hip extension 4/5 4/5  Hip abduction 4/5 4/5  Hip adduction    Hip internal rotation    Hip external rotation    Knee flexion 5/5 5/5  Knee extension 5/5 5/5  Ankle dorsiflexion    Ankle plantarflexion    Ankle inversion    Ankle eversion     (Blank rows = not tested)  LOWER EXTREMITY SPECIAL TESTS:  N/T  FUNCTIONAL TESTS:  5 times sit to stand: TBD SLS: <8 sec.   GAIT: Distance walked: in clinic Assistive device utilized: None Level of assistance: Complete Independence Comments: Slight R antalgic gait with initial steps after standing and increase distance walked.    11/7:  Reassessment of B hip strength grossly 4/5 MMT (flexion/ abduction/ adduction).  B knee strength grossly 5/5 MMT.     TODAY'S TREATMENT      DATE: 09/03/22  Subjective: Pt. States he had increase c/o R ride/side pain yesterday (no reason given).  Pt. Reports knee pain is better today but still present.  No subjective pain score.  Pt. Discussed having L knee replaced next year.     There.ex.:  Scifit L6 10 min. B UE/LE.  Warm-up/ discussed daily activity/ walking.    Forward/ backwards walking in //-bars with focus on step length/ BOS.    Supine SLR 10x2.  Hip abduction 10x2.    Supine R hip/knee manual isometrics 5x (mod-max. Resistance).    Manual tx.:  Assessment of R rib/ side pain in seated/ standing posture.  No tenderness present.  Supine R hamstring stretches 5x with static holds.  Supine knee flexion AA/PROM with holds (as tolerated)- pain limited  STM to distal R quad/ hamstring.  Patellar mobs (all planes)- grade III 3x 20 sec. Each.   Supine proximal tibia AP mobs. (Grade II-III)- 3x 20 sec.  R knee flexion AROM 121 deg. After manual tx.     Ice to R knee in supine position after tx. Session.       PATIENT EDUCATION:  Education details: See HEP Person educated: Patient Education method: Explanation, Demonstration, and Handouts Education comprehension: verbalized understanding and returned demonstration  HOME EXERCISE PROGRAM: Reviewed HHPT HEP.    ASSESSMENT:  CLINICAL IMPRESSION:  Pt. Has maintained R knee AROM gains over past couple weeks (121 deg. R knee AROM) in a pain tolerable range at end of tx. Session.  Excellent B quad/ hamstring strength but remains limited with hip strengthening (4/5 MMT).  Pt. Will  continue with daily stretches/ HEP and use of ice to increase R knee ROM/ pain-free mobility.  Pt. Will benefit from skilled PT services to increase R knee AROM to improve pain-free functional mobility.    OBJECTIVE IMPAIRMENTS: Abnormal gait, decreased balance, decreased coordination, decreased mobility, difficulty walking, decreased ROM, decreased strength, decreased safety awareness, hypomobility, impaired flexibility, improper body mechanics, and pain.   ACTIVITY LIMITATIONS: carrying, lifting, bending, sitting, standing, squatting, stairs, transfers, bed mobility, and locomotion level  PARTICIPATION LIMITATIONS: community activity  PERSONAL FACTORS: Age, Fitness, and Social background are also affecting patient's functional outcome.   REHAB POTENTIAL: Good  CLINICAL DECISION MAKING: Stable/uncomplicated  EVALUATION COMPLEXITY: Low   GOALS: Goals reviewed with patient? Yes  SHORT TERM GOALS: Target date: 08/25/22 Pt. Independent with HEP to increase R knee AROM to 115 deg. Flexion to improve pain-free mobility/ walking Baseline:  see above Goal status: Goal met   LONG TERM GOALS: Target date: 09/15/22  Pt. Will increase FOTO to 52 to improve pain-free mobility.   Baseline: initial FOTO 38 Goal status: INITIAL  2.  Pt. Will reports <5/10 R knee pain at worst with daily walking/ household  tasks.  Baseline: 10/10 pain at worst Goal status: INITIAL  3.  Pt. Will demonstrate more normalized gait pattern with initial steps after standing and during community mobility.   Baseline: R antalgic gait without use of assistive device.  Goal status: INITIAL    PLAN:  PT FREQUENCY: 2x/week  PT DURATION: 6 weeks  PLANNED INTERVENTIONS: Therapeutic exercises, Therapeutic activity, Neuromuscular re-education, Balance training, Gait training, Patient/Family education, Self Care, Joint mobilization, Aquatic Therapy, Electrical stimulation, Cryotherapy, Moist heat, and Manual therapy  PLAN FOR NEXT SESSION: Progress HEP  Pura Spice, PT, DPT # [phone] 09/11/2022, 1:30 PM

## 2022-09-14 ENCOUNTER — Ambulatory Visit: Payer: Medicare HMO | Admitting: Physical Therapy

## 2022-09-20 ENCOUNTER — Ambulatory Visit: Payer: Medicare HMO | Admitting: Physical Therapy

## 2022-09-22 ENCOUNTER — Ambulatory Visit: Payer: Medicare HMO | Admitting: Physical Therapy

## 2022-09-22 DIAGNOSIS — R269 Unspecified abnormalities of gait and mobility: Secondary | ICD-10-CM

## 2022-09-22 DIAGNOSIS — M6281 Muscle weakness (generalized): Secondary | ICD-10-CM

## 2022-09-22 DIAGNOSIS — G8929 Other chronic pain: Secondary | ICD-10-CM

## 2022-09-22 DIAGNOSIS — Z96651 Presence of right artificial knee joint: Secondary | ICD-10-CM

## 2022-09-25 NOTE — Therapy (Signed)
OUTPATIENT PHYSICAL THERAPY LOWER EXTREMITY TREATMENT   Patient Name: Melvin Johnson MRN: [id number] DOB:03/08/1941, 81 y.o., male Today's Date: 09/22/2022   PT End of Session - 09/25/22 0845     Visit Number 6    Number of Visits 13    Date for PT Re-Evaluation 09/15/22             Past Medical History:  Diagnosis Date   Arthritis    At risk for sleep apnea    STOP-BANG=4        SENT TO PCP 06-26-2014   Coronary artery disease    no cardiologist for several years , sees pcp  dr mark Sabra Heck   History of MI (myocardial infarction)    may 1999   Hyperlipidemia    Hypertension    Mild acid reflux    Neuropathy of both feet    burning sensation   Nocturia    Prostate cancer ([place]) 03/2014   Gleason 3+3=6, seed implant 07/03/14   S/P CABG x 4    Seasonal allergies    Type 2 diabetes mellitus ([place])    Wears dentures    UPPER   Wears glasses    Past Surgical History:  Procedure Laterality Date   CARDIAC CATHETERIZATION  1999  ([place])   CATARACT EXTRACTION W/PHACO Right 03/02/2021   Procedure: CATARACT EXTRACTION PHACO AND INTRAOCULAR LENS PLACEMENT ([place]) RIGHT DIABETIC 5.79 00:45.2;  Surgeon: Eulogio Bear, MD;  Location: [facility];  Service: Ophthalmology;  Laterality: Right;   CATARACT EXTRACTION W/PHACO Left 03/16/2021   Procedure: CATARACT EXTRACTION PHACO AND INTRAOCULAR LENS PLACEMENT ([facility]) LEFT DIABETIC 5.90 00:41.5;  Surgeon: Eulogio Bear, MD;  Location: [facility];  Service: Ophthalmology;  Laterality: Left;   CORONARY ARTERY BYPASS GRAFT  2003   dr vantright   4 VESSEL   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   RADIOACTIVE SEED IMPLANT N/A 07/03/2014   Procedure: RADIOACTIVE SEED IMPLANT;  Surgeon: Bernestine Amass, MD;  Location[facility];  Service: Urology;  Laterality: N/A;   TONSILLECTOMY  as child   Patient Active Problem List   Diagnosis Date Noted   Malignant neoplasm of prostate ([place]) 05/02/2014     PCP: Rusty Aus, MD   REFERRING PROVIDER: Lovell Sheehan, MD  REFERRING DIAG: History of total knee arthroplasty  THERAPY DIAG:  Hx of total knee replacement, right  Chronic pain of right knee  Muscle weakness  Gait difficulty  Rationale for Evaluation and Treatment Rehabilitation  ONSET DATE: 04/01/22  SUBJECTIVE:   SUBJECTIVE STATEMENT:    EVALUATION Pt. S/p R TKA on 04/01/22 and received skilled PT services at home.  Pt. States he was doing well until the PT was pushing on R knee to increase flexion and pt. Experienced significant pain.  Pt. Had a recent fall while getting out car and catching foot.  Pt. Reports no injury, just soreness.  Pt. Currently c/o 10/10 R knee pain, esp. With flexion/ increase activity.    PERTINENT HISTORY: See MD notes  PAIN:  Are you having pain? Yes: NPRS scale: 10/10 Pain location: R knee joint Pain description: aching/ sharp Aggravating factors: flexion/ increase activity Relieving factors: rest  PRECAUTIONS: None  WEIGHT BEARING RESTRICTIONS: No  FALLS:  Has patient fallen in last 6 months? Yes. Number of falls 1  LIVING ENVIRONMENT: Lives with: lives with their spouse Lives in: House/apartment Has following equipment at home: Single point cane  OCCUPATION: Retired, pts.  Wife has AKA and requires use of w/c  PLOF: Independent  PATIENT GOALS: Increase R knee ROM/ LE strengthening to improve pain-free mobility.     OBJECTIVE:   DIAGNOSTIC FINDINGS: See [facility] noteds  PATIENT SURVEYS:  FOTO initial 38/ goal 35  COGNITION: Overall cognitive status: Within functional limits for tasks assessed     SENSATION: WFL  EDEMA:  Circumferential: L/R knee joint line:  39.5/41 cm./ mid-gastroc  36/36 cm.).    POSTURE: rounded shoulders and forward head  PALPATION: R knee joint line tenderness.  Moderate R knee/ lower leg pitting edema as compared to L.    LOWER EXTREMITY ROM:  L/R knee: flexion (130 deg./ 107  deg.), extension (0 deg./ -2 deg.).   R knee flexion PROM: 117 deg. (15/10 pain reported).    LOWER EXTREMITY MMT:  MMT Right eval Left eval  Hip flexion 4/5 4/5  Hip extension 4/5 4/5  Hip abduction 4/5 4/5  Hip adduction    Hip internal rotation    Hip external rotation    Knee flexion 5/5 5/5  Knee extension 5/5 5/5  Ankle dorsiflexion    Ankle plantarflexion    Ankle inversion    Ankle eversion     (Blank rows = not tested)  LOWER EXTREMITY SPECIAL TESTS:  N/T  FUNCTIONAL TESTS:  5 times sit to stand: TBD SLS: <8 sec.   GAIT: Distance walked: in clinic Assistive device utilized: None Level of assistance: Complete Independence Comments: Slight R antalgic gait with initial steps after standing and increase distance walked.    11/7:  Reassessment of B hip strength grossly 4/5 MMT (flexion/ abduction/ adduction).  B knee strength grossly 5/5 MMT.     TODAY'S TREATMENT      DATE: 09/22/22   Visit arrived in error.  Pt. Attended PT visit of wife and no treatment today.     PATIENT EDUCATION:  Education details: See HEP Person educated: Patient Education method: Explanation, Demonstration, and Handouts Education comprehension: verbalized understanding and returned demonstration  HOME EXERCISE PROGRAM: Reviewed HHPT HEP.    ASSESSMENT:  CLINICAL IMPRESSION:  No treatment today.    OBJECTIVE IMPAIRMENTS: Abnormal gait, decreased balance, decreased coordination, decreased mobility, difficulty walking, decreased ROM, decreased strength, decreased safety awareness, hypomobility, impaired flexibility, improper body mechanics, and pain.   ACTIVITY LIMITATIONS: carrying, lifting, bending, sitting, standing, squatting, stairs, transfers, bed mobility, and locomotion level  PARTICIPATION LIMITATIONS: community activity  PERSONAL FACTORS: Age, Fitness, and Social background are also affecting patient's functional outcome.   REHAB POTENTIAL: Good  CLINICAL  DECISION MAKING: Stable/uncomplicated  EVALUATION COMPLEXITY: Low   GOALS: Goals reviewed with patient? Yes  SHORT TERM GOALS: Target date: 08/25/22 Pt. Independent with HEP to increase R knee AROM to 115 deg. Flexion to improve pain-free mobility/ walking Baseline:  see above Goal status: Goal met   LONG TERM GOALS: Target date: 09/15/22  Pt. Will increase FOTO to 52 to improve pain-free mobility.   Baseline: initial FOTO 38 Goal status: INITIAL  2.  Pt. Will reports <5/10 R knee pain at worst with daily walking/ household tasks.  Baseline: 10/10 pain at worst Goal status: INITIAL  3.  Pt. Will demonstrate more normalized gait pattern with initial steps after standing and during community mobility.   Baseline: R antalgic gait without use of assistive device.  Goal status: INITIAL    PLAN:  PT FREQUENCY: 2x/week  PT DURATION: 6 weeks  PLANNED INTERVENTIONS: Therapeutic exercises, Therapeutic activity, Neuromuscular re-education, Balance training, Gait  training, Patient/Family education, Self Care, Joint mobilization, Aquatic Therapy, Electrical stimulation, Cryotherapy, Moist heat, and Manual therapy  PLAN FOR NEXT SESSION: CHECK GOALS  Pura Spice, PT, DPT # [phone] 09/25/2022, 8:45 AM

## 2022-09-28 ENCOUNTER — Encounter: Payer: Medicare HMO | Admitting: Physical Therapy

## 2022-09-30 ENCOUNTER — Ambulatory Visit: Payer: Medicare HMO | Attending: Orthopedic Surgery | Admitting: Physical Therapy

## 2022-09-30 DIAGNOSIS — R269 Unspecified abnormalities of gait and mobility: Secondary | ICD-10-CM | POA: Insufficient documentation

## 2022-09-30 DIAGNOSIS — M25561 Pain in right knee: Secondary | ICD-10-CM | POA: Insufficient documentation

## 2022-09-30 DIAGNOSIS — M6281 Muscle weakness (generalized): Secondary | ICD-10-CM | POA: Insufficient documentation

## 2022-09-30 DIAGNOSIS — Z96651 Presence of right artificial knee joint: Secondary | ICD-10-CM | POA: Insufficient documentation

## 2022-09-30 DIAGNOSIS — G8929 Other chronic pain: Secondary | ICD-10-CM | POA: Insufficient documentation

## 2022-10-04 NOTE — Therapy (Signed)
OUTPATIENT PHYSICAL THERAPY LOWER EXTREMITY TREATMENT   Patient Name: Melvin Johnson MRN: [id number] DOB:11/13/1940, 81 y.o., male Today's Date: 09/22/2022   PT End of Session - 10/04/22 1949     Visit Number 6    Number of Visits 13    Date for PT Re-Evaluation 09/15/22             Past Medical History:  Diagnosis Date   Arthritis    At risk for sleep apnea    STOP-BANG=4        SENT TO PCP 06-26-2014   Coronary artery disease    no cardiologist for several years , sees pcp  dr mark Sabra Heck   History of MI (myocardial infarction)    may 1999   Hyperlipidemia    Hypertension    Mild acid reflux    Neuropathy of both feet    burning sensation   Nocturia    Prostate cancer ([place]) 03/2014   Gleason 3+3=6, seed implant 07/03/14   S/P CABG x 4    Seasonal allergies    Type 2 diabetes mellitus ([place])    Wears dentures    UPPER   Wears glasses    Past Surgical History:  Procedure Laterality Date   CARDIAC CATHETERIZATION  1999  ([place])   CATARACT EXTRACTION W/PHACO Right 03/02/2021   Procedure: CATARACT EXTRACTION PHACO AND INTRAOCULAR LENS PLACEMENT ([place]) RIGHT DIABETIC 5.79 00:45.2;  Surgeon: Eulogio Bear, MD;  Location: [place];  Service: Ophthalmology;  Laterality: Right;   CATARACT EXTRACTION W/PHACO Left 03/16/2021   Procedure: CATARACT EXTRACTION PHACO AND INTRAOCULAR LENS PLACEMENT ([facility]) LEFT DIABETIC 5.90 00:41.5;  Surgeon: Eulogio Bear, MD;  Location: [place];  Service: Ophthalmology;  Laterality: Left;   CORONARY ARTERY BYPASS GRAFT  2003   dr vantright   4 VESSEL   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   RADIOACTIVE SEED IMPLANT N/A 07/03/2014   Procedure: RADIOACTIVE SEED IMPLANT;  Surgeon: Bernestine Amass, MD;  Location: [facility];  Service: Urology;  Laterality: N/A;   TONSILLECTOMY  as child   Patient Active Problem List   Diagnosis Date Noted   Malignant neoplasm of prostate ([place]) 05/02/2014     PCP: Rusty Aus, MD   REFERRING PROVIDER: Lovell Sheehan, MD  REFERRING DIAG: History of total knee arthroplasty  THERAPY DIAG:  Hx of total knee replacement, right  Chronic pain of right knee  Muscle weakness  Gait difficulty  Rationale for Evaluation and Treatment Rehabilitation  ONSET DATE: 04/01/22  SUBJECTIVE:   SUBJECTIVE STATEMENT:    EVALUATION Pt. S/p R TKA on 04/01/22 and received skilled PT services at home.  Pt. States he was doing well until the PT was pushing on R knee to increase flexion and pt. Experienced significant pain.  Pt. Had a recent fall while getting out car and catching foot.  Pt. Reports no injury, just soreness.  Pt. Currently c/o 10/10 R knee pain, esp. With flexion/ increase activity.    PERTINENT HISTORY: See MD notes  PAIN:  Are you having pain? Yes: NPRS scale: 10/10 Pain location: R knee joint Pain description: aching/ sharp Aggravating factors: flexion/ increase activity Relieving factors: rest  PRECAUTIONS: None  WEIGHT BEARING RESTRICTIONS: No  FALLS:  Has patient fallen in last 6 months? Yes. Number of falls 1  LIVING ENVIRONMENT: Lives with: lives with their spouse Lives in: House/apartment Has following equipment at home: Single point cane  OCCUPATION: Retired, pts.  Wife has AKA and requires use of w/c  PLOF: Independent  PATIENT GOALS: Increase R knee ROM/ LE strengthening to improve pain-free mobility.     OBJECTIVE:   DIAGNOSTIC FINDINGS: See [facility] noteds  PATIENT SURVEYS:  FOTO initial 38/ goal 71  COGNITION: Overall cognitive status: Within functional limits for tasks assessed     SENSATION: WFL  EDEMA:  Circumferential: L/R knee joint line:  39.5/41 cm./ mid-gastroc  36/36 cm.).    POSTURE: rounded shoulders and forward head  PALPATION: R knee joint line tenderness.  Moderate R knee/ lower leg pitting edema as compared to L.    LOWER EXTREMITY ROM:  L/R knee: flexion (130 deg./ 107  deg.), extension (0 deg./ -2 deg.).   R knee flexion PROM: 117 deg. (15/10 pain reported).    LOWER EXTREMITY MMT:  MMT Right eval Left eval  Hip flexion 4/5 4/5  Hip extension 4/5 4/5  Hip abduction 4/5 4/5  Hip adduction    Hip internal rotation    Hip external rotation    Knee flexion 5/5 5/5  Knee extension 5/5 5/5  Ankle dorsiflexion    Ankle plantarflexion    Ankle inversion    Ankle eversion     (Blank rows = not tested)  LOWER EXTREMITY SPECIAL TESTS:  N/T  FUNCTIONAL TESTS:  5 times sit to stand: TBD SLS: <8 sec.   GAIT: Distance walked: in clinic Assistive device utilized: None Level of assistance: Complete Independence Comments: Slight R antalgic gait with initial steps after standing and increase distance walked.    11/7:  Reassessment of B hip strength grossly 4/5 MMT (flexion/ abduction/ adduction).  B knee strength grossly 5/5 MMT.     TODAY'S TREATMENT      DATE: 10/04/22   Visit arrived in error.  Pt. Attended PT visit of wife and no treatment today.  Pt. C/o moderate L back/ knee pain. Pt. Ambulates into waiting room with antalgic gait.  PT applied MH to low back/ ice to L knee while pts. Wife attended PT. Pt. Instructed in use of SPC on R side to decrease L knee symptoms with walking/ daily tasks.  Pt. Instructed to f/u with MD.     PATIENT EDUCATION:  Education details: See HEP Person educated: Patient Education method: Explanation, Demonstration, and Handouts Education comprehension: verbalized understanding and returned demonstration  HOME EXERCISE PROGRAM: Reviewed HHPT HEP.    ASSESSMENT:  CLINICAL IMPRESSION:  No treatment today.    OBJECTIVE IMPAIRMENTS: Abnormal gait, decreased balance, decreased coordination, decreased mobility, difficulty walking, decreased ROM, decreased strength, decreased safety awareness, hypomobility, impaired flexibility, improper body mechanics, and pain.   ACTIVITY LIMITATIONS: carrying, lifting,  bending, sitting, standing, squatting, stairs, transfers, bed mobility, and locomotion level  PARTICIPATION LIMITATIONS: community activity  PERSONAL FACTORS: Age, Fitness, and Social background are also affecting patient's functional outcome.   REHAB POTENTIAL: Good  CLINICAL DECISION MAKING: Stable/uncomplicated  EVALUATION COMPLEXITY: Low   GOALS: Goals reviewed with patient? Yes  SHORT TERM GOALS: Target date: 08/25/22 Pt. Independent with HEP to increase R knee AROM to 115 deg. Flexion to improve pain-free mobility/ walking Baseline:  see above Goal status: Goal met   LONG TERM GOALS: Target date: 09/15/22  Pt. Will increase FOTO to 52 to improve pain-free mobility.   Baseline: initial FOTO 38 Goal status: INITIAL  2.  Pt. Will reports <5/10 R knee pain at worst with daily walking/ household tasks.  Baseline: 10/10 pain at worst Goal status: INITIAL  3.  Pt. Will demonstrate more normalized gait pattern with initial steps after standing and during community mobility.   Baseline: R antalgic gait without use of assistive device.  Goal status: INITIAL    PLAN:  PT FREQUENCY: 2x/week  PT DURATION: 6 weeks  PLANNED INTERVENTIONS: Therapeutic exercises, Therapeutic activity, Neuromuscular re-education, Balance training, Gait training, Patient/Family education, Self Care, Joint mobilization, Aquatic Therapy, Electrical stimulation, Cryotherapy, Moist heat, and Manual therapy  PLAN FOR NEXT SESSION: CHECK GOALS  Pura Spice, PT, DPT # [id number] 10/04/2022, 7:50 PM

## 2022-10-19 ENCOUNTER — Ambulatory Visit: Payer: Medicare HMO | Admitting: Physical Therapy

## 2022-10-26 ENCOUNTER — Ambulatory Visit: Payer: Medicare HMO | Attending: Orthopedic Surgery | Admitting: Physical Therapy

## 2022-10-26 DIAGNOSIS — G8929 Other chronic pain: Secondary | ICD-10-CM | POA: Diagnosis present

## 2022-10-26 DIAGNOSIS — Z96651 Presence of right artificial knee joint: Secondary | ICD-10-CM | POA: Diagnosis not present

## 2022-10-26 DIAGNOSIS — M6281 Muscle weakness (generalized): Secondary | ICD-10-CM | POA: Diagnosis present

## 2022-10-26 DIAGNOSIS — M25561 Pain in right knee: Secondary | ICD-10-CM | POA: Diagnosis present

## 2022-10-26 DIAGNOSIS — M25562 Pain in left knee: Secondary | ICD-10-CM | POA: Diagnosis present

## 2022-10-26 DIAGNOSIS — R269 Unspecified abnormalities of gait and mobility: Secondary | ICD-10-CM | POA: Diagnosis present

## 2022-10-28 ENCOUNTER — Ambulatory Visit: Payer: Medicare HMO | Admitting: Physical Therapy

## 2022-10-28 DIAGNOSIS — R269 Unspecified abnormalities of gait and mobility: Secondary | ICD-10-CM

## 2022-10-28 DIAGNOSIS — G8929 Other chronic pain: Secondary | ICD-10-CM

## 2022-10-28 DIAGNOSIS — Z96651 Presence of right artificial knee joint: Secondary | ICD-10-CM

## 2022-10-28 DIAGNOSIS — M6281 Muscle weakness (generalized): Secondary | ICD-10-CM

## 2022-11-02 ENCOUNTER — Ambulatory Visit: Payer: Medicare HMO | Admitting: Physical Therapy

## 2022-11-04 ENCOUNTER — Ambulatory Visit: Payer: Medicare HMO | Admitting: Physical Therapy

## 2022-11-04 DIAGNOSIS — G8929 Other chronic pain: Secondary | ICD-10-CM

## 2022-11-04 DIAGNOSIS — M6281 Muscle weakness (generalized): Secondary | ICD-10-CM

## 2022-11-04 DIAGNOSIS — R269 Unspecified abnormalities of gait and mobility: Secondary | ICD-10-CM

## 2022-11-04 DIAGNOSIS — Z96651 Presence of right artificial knee joint: Secondary | ICD-10-CM

## 2022-11-06 NOTE — Therapy (Signed)
OUTPATIENT PHYSICAL THERAPY LOWER EXTREMITY TREATMENT   Patient Name: Melvin Johnson MRN: [id number] DOB:12-16-1940, 82 y.o., male Today's Date: 11/04/2022   PT End of Session - 11/06/22 2118     Visit Number 7    Number of Visits 23    Date for PT Re-Evaluation 12/21/22    PT Start Time 1605    PT Stop Time 7915    PT Time Calculation (min) 45 min             Past Medical History:  Diagnosis Date   Arthritis    At risk for sleep apnea    STOP-BANG=4        SENT TO PCP 06-26-2014   Coronary artery disease    no cardiologist for several years , sees pcp  dr mark Sabra Heck   History of MI (myocardial infarction)    may 1999   Hyperlipidemia    Hypertension    Mild acid reflux    Neuropathy of both feet    burning sensation   Nocturia    Prostate cancer ([place]) 03/2014   Gleason 3+3=6, seed implant 07/03/14   S/P CABG x 4    Seasonal allergies    Type 2 diabetes mellitus ([place])    Wears dentures    UPPER   Wears glasses    Past Surgical History:  Procedure Laterality Date   CARDIAC CATHETERIZATION  1999  ([place])   CATARACT EXTRACTION W/PHACO Right 03/02/2021   Procedure: CATARACT EXTRACTION PHACO AND INTRAOCULAR LENS PLACEMENT ([place]) RIGHT DIABETIC 5.79 00:45.2;  Surgeon: Eulogio Bear, MD;  Location: [place];  Service: Ophthalmology;  Laterality: Right;   CATARACT EXTRACTION W/PHACO Left 03/16/2021   Procedure: CATARACT EXTRACTION PHACO AND INTRAOCULAR LENS PLACEMENT ([facility]) LEFT DIABETIC 5.90 00:41.5;  Surgeon: Eulogio Bear, MD;  Location: [place];  Service: Ophthalmology;  Laterality: Left;   CORONARY ARTERY BYPASS GRAFT  2003   dr vantright   4 VESSEL   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   RADIOACTIVE SEED IMPLANT N/A 07/03/2014   Procedure: RADIOACTIVE SEED IMPLANT;  Surgeon: Bernestine Amass, MD;  Location: [facility];  Service: Urology;  Laterality: N/A;   TONSILLECTOMY  as child   Patient Active Problem  List   Diagnosis Date Noted   Malignant neoplasm of prostate ([place]) 05/02/2014    PCP: Rusty Aus, MD   REFERRING PROVIDER: Lovell Sheehan, MD  REFERRING DIAG: History of total knee arthroplasty  THERAPY DIAG:  Hx of total knee replacement, right  Chronic pain of right knee  Chronic pain of left knee  Muscle weakness  Gait difficulty  Rationale for Evaluation and Treatment Rehabilitation  ONSET DATE: 04/01/22  SUBJECTIVE:   SUBJECTIVE STATEMENT:    EVALUATION Pt. S/p R TKA on 04/01/22 and received skilled PT services at home.  Pt. States he was doing well until the PT was pushing on R knee to increase flexion and pt. Experienced significant pain.  Pt. Had a recent fall while getting out car and catching foot.  Pt. Reports no injury, just soreness.  Pt. Currently c/o 10/10 R knee pain, esp. With flexion/ increase activity.    PERTINENT HISTORY: See MD notes  PAIN:  Are you having pain? Yes: NPRS scale: 10/10 Pain location: R knee joint Pain description: aching/ sharp Aggravating factors: flexion/ increase activity Relieving factors: rest  PRECAUTIONS: None  WEIGHT BEARING RESTRICTIONS: No  FALLS:  Has patient fallen in last 6 months?  Yes. Number of falls 1  LIVING ENVIRONMENT: Lives with: lives with their spouse Lives in: House/apartment Has following equipment at home: Single point cane  OCCUPATION: Retired, pts. Wife has AKA and requires use of w/c  PLOF: Independent  PATIENT GOALS: Increase R knee ROM/ LE strengthening to improve pain-free mobility.     OBJECTIVE:   DIAGNOSTIC FINDINGS: See [facility] noteds  PATIENT SURVEYS:  FOTO initial 38/ goal 73  COGNITION: Overall cognitive status: Within functional limits for tasks assessed     SENSATION: WFL  EDEMA:  Circumferential: L/R knee joint line:  39.5/41 cm./ mid-gastroc  36/36 cm.).    POSTURE: rounded shoulders and forward head  PALPATION: R knee joint line tenderness.  Moderate R  knee/ lower leg pitting edema as compared to L.    LOWER EXTREMITY ROM:  L/R knee: flexion (130 deg./ 107 deg.), extension (0 deg./ -2 deg.).   R knee flexion PROM: 117 deg. (15/10 pain reported).    LOWER EXTREMITY MMT:  MMT Right eval Left eval  Hip flexion 4/5 4/5  Hip extension 4/5 4/5  Hip abduction 4/5 4/5  Hip adduction    Hip internal rotation    Hip external rotation    Knee flexion 5/5 5/5  Knee extension 5/5 5/5  Ankle dorsiflexion    Ankle plantarflexion    Ankle inversion    Ankle eversion     (Blank rows = not tested)  LOWER EXTREMITY SPECIAL TESTS:  N/T  FUNCTIONAL TESTS:  5 times sit to stand: TBD SLS: <8 sec.   GAIT: Distance walked: in clinic Assistive device utilized: None Level of assistance: Complete Independence Comments: Slight R antalgic gait with initial steps after standing and increase distance walked.    11/7:  Reassessment of B hip strength grossly 4/5 MMT (flexion/ abduction/ adduction).  B knee strength grossly 5/5 MMT.     1/2: L knee AROM in supine:  -2 to 127 deg.  Hamstring length: 63 deg. (-) L LE SLR.  L knee PROM flexion: 130 deg. (Pain noted).   R knee AROM: -3 to 123 deg. (Marked improvement in R knee AROM over past couple months).   Standing lumbar flexion WFL/ extension 50% limited/ rotn. 25% limited.  Tenderness and hypomobility noted in lumbar spine (L/R).      TODAY'S TREATMENT      DATE: 11/04/22   No charge from tx. (Pts. Wife receiving PT for AKA)  Subjective:   Pt. Entered PT with marked improvement in gait pattern while using SPC.  Pt. Reports continued B knee pain, esp. With cooler, rainy weather.     No manual tx. Today.   There.ex.:  Nustep L2 15 min. B UE/LE.  Consistent cadence.    PT reviewed HEP: supine quad sets/ heel slides/ SLR/ hip abduction.    PT reviewed pts. Gait with marked improve while using SPC as compared to last tx. Session.    PATIENT EDUCATION:  Education details: See  HEP Person educated: Patient Education method: Explanation, Demonstration, and Handouts Education comprehension: verbalized understanding and returned demonstration  HOME EXERCISE PROGRAM: Reviewed HEP   ASSESSMENT:  CLINICAL IMPRESSION:  Pt. Completed Nustep and supine there.ex. with SPT while pts. Wife worked on Personnel officer with prosthetic leg.  No charge for PT tx. Session.     OBJECTIVE IMPAIRMENTS: Abnormal gait, decreased balance, decreased coordination, decreased mobility, difficulty walking, decreased ROM, decreased strength, decreased safety awareness, hypomobility, impaired flexibility, improper body mechanics, and pain.   ACTIVITY LIMITATIONS:  carrying, lifting, bending, sitting, standing, squatting, stairs, transfers, bed mobility, and locomotion level  PARTICIPATION LIMITATIONS: community activity  PERSONAL FACTORS: Age, Fitness, and Social background are also affecting patient's functional outcome.   REHAB POTENTIAL: Good  CLINICAL DECISION MAKING: Stable/uncomplicated  EVALUATION COMPLEXITY: Low   GOALS: Goals reviewed with patient? Yes  SHORT TERM GOALS: Target date: 11/23/22 Pt. Independent with HEP to increase L knee AROM to >120 deg. With no pain to improve mobility/ walking Baseline:  see above Goal status: Initial   LONG TERM GOALS: Target date: 12/21/22  Pt. Will increase FOTO to 52 to improve pain-free mobility.   Baseline: initial FOTO 38 Goal status: On-going  2.  Pt. Will reports <3/10 B knee pain at worst with daily walking/ household tasks.  Baseline: 10/10 pain at worst Goal status: On-going  3.  Pt. Will demonstrate more normalized gait pattern with initial steps after standing and during community mobility without assistive device.   Baseline: Antalgic gait without use of assistive device.  Marked improvement with use of SPC and instruction Goal status: Not met    PLAN:  PT FREQUENCY: 2x/week  PT DURATION: 8 weeks  PLANNED  INTERVENTIONS: Therapeutic exercises, Therapeutic activity, Neuromuscular re-education, Balance training, Gait training, Patient/Family education, Self Care, Joint mobilization, Aquatic Therapy, Electrical stimulation, Cryotherapy, Moist heat, and Manual therapy  PLAN FOR NEXT SESSION: Issue new HEP  Pura Spice, PT, DPT # [id number] 11/06/2022, 9:19 PM

## 2022-11-06 NOTE — Therapy (Signed)
OUTPATIENT PHYSICAL THERAPY LOWER EXTREMITY TREATMENT/ RECERTIFICATION   Patient Name: Melvin Johnson MRN: [id number] DOB:May 07, 1941, 82 y.o., male Today's Date: 10/28/2022   PT End of Session - 11/06/22 1123     Visit Number 7    Number of Visits 23    Date for PT Re-Evaluation 12/21/22    PT Start Time 5681    PT Stop Time 2751    PT Time Calculation (min) 31 min             Past Medical History:  Diagnosis Date   Arthritis    At risk for sleep apnea    STOP-BANG=4        SENT TO PCP 06-26-2014   Coronary artery disease    no cardiologist for several years , sees pcp  dr mark Sabra Heck   History of MI (myocardial infarction)    may 1999   Hyperlipidemia    Hypertension    Mild acid reflux    Neuropathy of both feet    burning sensation   Nocturia    Prostate cancer ([place]) 03/2014   Gleason 3+3=6, seed implant 07/03/14   S/P CABG x 4    Seasonal allergies    Type 2 diabetes mellitus ([place])    Wears dentures    UPPER   Wears glasses    Past Surgical History:  Procedure Laterality Date   CARDIAC CATHETERIZATION  1999  ([place])   CATARACT EXTRACTION W/PHACO Right 03/02/2021   Procedure: CATARACT EXTRACTION PHACO AND INTRAOCULAR LENS PLACEMENT ([place]) RIGHT DIABETIC 5.79 00:45.2;  Surgeon: Eulogio Bear, MD;  Location: [place];  Service: Ophthalmology;  Laterality: Right;   CATARACT EXTRACTION W/PHACO Left 03/16/2021   Procedure: CATARACT EXTRACTION PHACO AND INTRAOCULAR LENS PLACEMENT ([facility]) LEFT DIABETIC 5.90 00:41.5;  Surgeon: Eulogio Bear, MD;  Location: [place];  Service: Ophthalmology;  Laterality: Left;   CORONARY ARTERY BYPASS GRAFT  2003   dr vantright   4 VESSEL   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   RADIOACTIVE SEED IMPLANT N/A 07/03/2014   Procedure: RADIOACTIVE SEED IMPLANT;  Surgeon: Bernestine Amass, MD;  Location: [facility];  Service: Urology;  Laterality: N/A;   TONSILLECTOMY  as child   Patient  Active Problem List   Diagnosis Date Noted   Malignant neoplasm of prostate ([place]) 05/02/2014    PCP: Rusty Aus, MD   REFERRING PROVIDER: Lovell Sheehan, MD  REFERRING DIAG: History of total knee arthroplasty  THERAPY DIAG:  Hx of total knee replacement, right  Chronic pain of right knee  Chronic pain of left knee  Muscle weakness  Gait difficulty  Rationale for Evaluation and Treatment Rehabilitation  ONSET DATE: 04/01/22  SUBJECTIVE:   SUBJECTIVE STATEMENT:    EVALUATION Pt. S/p R TKA on 04/01/22 and received skilled PT services at home.  Pt. States he was doing well until the PT was pushing on R knee to increase flexion and pt. Experienced significant pain.  Pt. Had a recent fall while getting out car and catching foot.  Pt. Reports no injury, just soreness.  Pt. Currently c/o 10/10 R knee pain, esp. With flexion/ increase activity.    PERTINENT HISTORY: See MD notes  PAIN:  Are you having pain? Yes: NPRS scale: 10/10 Pain location: R knee joint Pain description: aching/ sharp Aggravating factors: flexion/ increase activity Relieving factors: rest  PRECAUTIONS: None  WEIGHT BEARING RESTRICTIONS: No  FALLS:  Has patient fallen in last 6  months? Yes. Number of falls 1  LIVING ENVIRONMENT: Lives with: lives with their spouse Lives in: House/apartment Has following equipment at home: Single point cane  OCCUPATION: Retired, pts. Wife has AKA and requires use of w/c  PLOF: Independent  PATIENT GOALS: Increase R knee ROM/ LE strengthening to improve pain-free mobility.     OBJECTIVE:   DIAGNOSTIC FINDINGS: See [facility] noteds  PATIENT SURVEYS:  FOTO initial 38/ goal 17  COGNITION: Overall cognitive status: Within functional limits for tasks assessed     SENSATION: WFL  EDEMA:  Circumferential: L/R knee joint line:  39.5/41 cm./ mid-gastroc  36/36 cm.).    POSTURE: rounded shoulders and forward head  PALPATION: R knee joint line  tenderness.  Moderate R knee/ lower leg pitting edema as compared to L.    LOWER EXTREMITY ROM:  L/R knee: flexion (130 deg./ 107 deg.), extension (0 deg./ -2 deg.).   R knee flexion PROM: 117 deg. (15/10 pain reported).    LOWER EXTREMITY MMT:  MMT Right eval Left eval  Hip flexion 4/5 4/5  Hip extension 4/5 4/5  Hip abduction 4/5 4/5  Hip adduction    Hip internal rotation    Hip external rotation    Knee flexion 5/5 5/5  Knee extension 5/5 5/5  Ankle dorsiflexion    Ankle plantarflexion    Ankle inversion    Ankle eversion     (Blank rows = not tested)  LOWER EXTREMITY SPECIAL TESTS:  N/T  FUNCTIONAL TESTS:  5 times sit to stand: TBD SLS: <8 sec.   GAIT: Distance walked: in clinic Assistive device utilized: None Level of assistance: Complete Independence Comments: Slight R antalgic gait with initial steps after standing and increase distance walked.    11/7:  Reassessment of B hip strength grossly 4/5 MMT (flexion/ abduction/ adduction).  B knee strength grossly 5/5 MMT.     1/2: L knee AROM in supine:  -2 to 127 deg.  Hamstring length: 63 deg. (-) L LE SLR.  L knee PROM flexion: 130 deg. (Pain noted).   R knee AROM: -3 to 123 deg. (Marked improvement in R knee AROM over past couple months).   Standing lumbar flexion WFL/ extension 50% limited/ rotn. 25% limited.  Tenderness and hypomobility noted in lumbar spine (L/R).      TODAY'S TREATMENT      DATE: 10/28/22   No charge from tx. (Pts. Wife receiving PT for AKA)  Subjective:   Pt. Entered PT with marked improvement in gait pattern while using SPC.  Pt. States he is doing somewhat better with respiratory illness and mobility.  L knee pain present.    No manual tx. Today.   There.ex.:  No Nustep today due to fatigue entering PT clinic.   PT reviewed HEP: supine quad sets/ heel slides/ SLR/ hip abduction.    PT reviewed pts. Gait with marked improve while using SPC as compared to last tx. Session.     PATIENT EDUCATION:  Education details: See HEP Person educated: Patient Education method: Explanation, Demonstration, and Handouts Education comprehension: verbalized understanding and returned demonstration  HOME EXERCISE PROGRAM: Reviewed HEP   ASSESSMENT:  CLINICAL IMPRESSION:  Pt. Presents to PT with moderate L antalgic gait pattern/ L low back and knee pain.  PT discussed the benefits of using SPC to correct step pattern/ posture.  Moderate lateral sway in trunk during gait without use of SPC.  Pt. Presents with decrease/ pain limited L knee flexion and quad muscle weakness.  Pt. Will benefit from short term skilled PT services to increase L knee ROM/ stability prior to upcoming knee replacement.  See updated goals.     OBJECTIVE IMPAIRMENTS: Abnormal gait, decreased balance, decreased coordination, decreased mobility, difficulty walking, decreased ROM, decreased strength, decreased safety awareness, hypomobility, impaired flexibility, improper body mechanics, and pain.   ACTIVITY LIMITATIONS: carrying, lifting, bending, sitting, standing, squatting, stairs, transfers, bed mobility, and locomotion level  PARTICIPATION LIMITATIONS: community activity  PERSONAL FACTORS: Age, Fitness, and Social background are also affecting patient's functional outcome.   REHAB POTENTIAL: Good  CLINICAL DECISION MAKING: Stable/uncomplicated  EVALUATION COMPLEXITY: Low   GOALS: Goals reviewed with patient? Yes  SHORT TERM GOALS: Target date: 11/23/22 Pt. Independent with HEP to increase L knee AROM to >120 deg. With no pain to improve mobility/ walking Baseline:  see above Goal status: Initial   LONG TERM GOALS: Target date: 12/21/22  Pt. Will increase FOTO to 52 to improve pain-free mobility.   Baseline: initial FOTO 38 Goal status: On-going  2.  Pt. Will reports <3/10 B knee pain at worst with daily walking/ household tasks.  Baseline: 10/10 pain at worst Goal status:  On-going  3.  Pt. Will demonstrate more normalized gait pattern with initial steps after standing and during community mobility without assistive device.   Baseline: Antalgic gait without use of assistive device.  Marked improvement with use of SPC and instruction Goal status: Not met    PLAN:  PT FREQUENCY: 2x/week  PT DURATION: 8 weeks  PLANNED INTERVENTIONS: Therapeutic exercises, Therapeutic activity, Neuromuscular re-education, Balance training, Gait training, Patient/Family education, Self Care, Joint mobilization, Aquatic Therapy, Electrical stimulation, Cryotherapy, Moist heat, and Manual therapy  PLAN FOR NEXT SESSION: Issue new HEP  Pura Spice, PT, DPT # [id number] 11/06/2022, 11:24 AM

## 2022-11-06 NOTE — Therapy (Signed)
OUTPATIENT PHYSICAL THERAPY LOWER EXTREMITY TREATMENT/ RECERTIFICATION   Patient Name: Melvin Johnson MRN: [id number] DOB:1940/11/23, 82 y.o., male Today's Date: 10/26/2022   PT End of Session - 11/06/22 1043     Visit Number 7    Number of Visits 23    Date for PT Re-Evaluation 12/21/22    PT Start Time 1710    PT Stop Time 1800    PT Time Calculation (min) 50 min             Past Medical History:  Diagnosis Date   Arthritis    At risk for sleep apnea    STOP-BANG=4        SENT TO PCP 06-26-2014   Coronary artery disease    no cardiologist for several years , sees pcp  dr mark Sabra Heck   History of MI (myocardial infarction)    may 1999   Hyperlipidemia    Hypertension    Mild acid reflux    Neuropathy of both feet    burning sensation   Nocturia    Prostate cancer ([place]) 03/2014   Gleason 3+3=6, seed implant 07/03/14   S/P CABG x 4    Seasonal allergies    Type 2 diabetes mellitus ([place])    Wears dentures    UPPER   Wears glasses    Past Surgical History:  Procedure Laterality Date   CARDIAC CATHETERIZATION  1999  ([place])   CATARACT EXTRACTION W/PHACO Right 03/02/2021   Procedure: CATARACT EXTRACTION PHACO AND INTRAOCULAR LENS PLACEMENT ([place]) RIGHT DIABETIC 5.79 00:45.2;  Surgeon: Eulogio Bear, MD;  Location: [place] [facility];  Service: Ophthalmology;  Laterality: Right;   CATARACT EXTRACTION W/PHACO Left 03/16/2021   Procedure: CATARACT EXTRACTION PHACO AND INTRAOCULAR LENS PLACEMENT ([facility]) LEFT DIABETIC 5.90 00:41.5;  Surgeon: Eulogio Bear, MD;  Location: [place];  Service: Ophthalmology;  Laterality: Left;   CORONARY ARTERY BYPASS GRAFT  2003   dr vantright   4 VESSEL   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   RADIOACTIVE SEED IMPLANT N/A 07/03/2014   Procedure: RADIOACTIVE SEED IMPLANT;  Surgeon: Bernestine Amass, MD;  Location: [facility];  Service: Urology;  Laterality: N/A;   TONSILLECTOMY  as child   Patient  Active Problem List   Diagnosis Date Noted   Malignant neoplasm of prostate ([place]) 05/02/2014    PCP: Rusty Aus, MD   REFERRING PROVIDER: Lovell Sheehan, MD  REFERRING DIAG: History of total knee arthroplasty  THERAPY DIAG:  Hx of total knee replacement, right  Chronic pain of right knee  Chronic pain of left knee  Muscle weakness  Gait difficulty  Rationale for Evaluation and Treatment Rehabilitation  ONSET DATE: 04/01/22  SUBJECTIVE:   SUBJECTIVE STATEMENT:    EVALUATION Pt. S/p R TKA on 04/01/22 and received skilled PT services at home.  Pt. States he was doing well until the PT was pushing on R knee to increase flexion and pt. Experienced significant pain.  Pt. Had a recent fall while getting out car and catching foot.  Pt. Reports no injury, just soreness.  Pt. Currently c/o 10/10 R knee pain, esp. With flexion/ increase activity.    PERTINENT HISTORY: See MD notes  PAIN:  Are you having pain? Yes: NPRS scale: 10/10 Pain location: R knee joint Pain description: aching/ sharp Aggravating factors: flexion/ increase activity Relieving factors: rest  PRECAUTIONS: None  WEIGHT BEARING RESTRICTIONS: No  FALLS:  Has patient fallen in last 6  months? Yes. Number of falls 1  LIVING ENVIRONMENT: Lives with: lives with their spouse Lives in: House/apartment Has following equipment at home: Single point cane  OCCUPATION: Retired, pts. Wife has AKA and requires use of w/c  PLOF: Independent  PATIENT GOALS: Increase R knee ROM/ LE strengthening to improve pain-free mobility.     OBJECTIVE:   DIAGNOSTIC FINDINGS: See [facility] noteds  PATIENT SURVEYS:  FOTO initial 38/ goal 1  COGNITION: Overall cognitive status: Within functional limits for tasks assessed     SENSATION: WFL  EDEMA:  Circumferential: L/R knee joint line:  39.5/41 cm./ mid-gastroc  36/36 cm.).    POSTURE: rounded shoulders and forward head  PALPATION: R knee joint line  tenderness.  Moderate R knee/ lower leg pitting edema as compared to L.    LOWER EXTREMITY ROM:  L/R knee: flexion (130 deg./ 107 deg.), extension (0 deg./ -2 deg.).   R knee flexion PROM: 117 deg. (15/10 pain reported).    LOWER EXTREMITY MMT:  MMT Right eval Left eval  Hip flexion 4/5 4/5  Hip extension 4/5 4/5  Hip abduction 4/5 4/5  Hip adduction    Hip internal rotation    Hip external rotation    Knee flexion 5/5 5/5  Knee extension 5/5 5/5  Ankle dorsiflexion    Ankle plantarflexion    Ankle inversion    Ankle eversion     (Blank rows = not tested)  LOWER EXTREMITY SPECIAL TESTS:  N/T  FUNCTIONAL TESTS:  5 times sit to stand: TBD SLS: <8 sec.   GAIT: Distance walked: in clinic Assistive device utilized: None Level of assistance: Complete Independence Comments: Slight R antalgic gait with initial steps after standing and increase distance walked.    11/7:  Reassessment of B hip strength grossly 4/5 MMT (flexion/ abduction/ adduction).  B knee strength grossly 5/5 MMT.     TODAY'S TREATMENT      DATE: 10/26/22   Subjective:   Pt. Referred back to PT with a new MD order for L low back/ knee pain.  Pt. Is planning to have L TKA in March (no date set at this time).  Pt. Reports 6/10 L low back/ knee pain and c/o numbness in L knee to foot.  Pt. States R knee is doing better and L knee is primary complaint.  Pt. Has been sick in bed/ hurting over past several weeks and unable to attend PT or bring wife to PT for prosthetic training.    L knee AROM in supine:  -2 to 127 deg.  Hamstring length: 63 deg. (-) L LE SLR.  L knee PROM flexion: 130 deg. (Pain noted).   R knee AROM: -3 to 123 deg. (Marked improvement in R knee AROM over past couple months).   Standing lumbar flexion WFL/ extension 50% limited/ rotn. 25% limited.  Tenderness and hypomobility noted in lumbar spine (L/R).    Manual tx.:  Supine L knee AP/PA/inferior patellar mobs (grade II-III) 3x20 sec.  Each.  Supine L LE neural glides/ hamstring/ knee to chest stretches.  Supine L proximal tibia AP grade III mobs. 3x30 sec.  There.ex.:  No Nustep today  Supine quad sets/ heel slides/ SLR/ unable to bridge secondary to low back pain.  Reviewed HEP/ discussed goals.    PATIENT EDUCATION:  Education details: See HEP Person educated: Patient Education method: Explanation, Demonstration, and Handouts Education comprehension: verbalized understanding and returned demonstration  HOME EXERCISE PROGRAM: Reviewed HEP   ASSESSMENT:  CLINICAL  IMPRESSION:  Pt. Presents to PT with moderate L antalgic gait pattern/ L low back and knee pain.  PT discussed the benefits of using SPC to correct step pattern/ posture.  Moderate lateral sway in trunk during gait without use of SPC.  Pt. Presents with decrease/ pain limited L knee flexion and quad muscle weakness. Pt. Will benefit from short term skilled PT services to increase L knee ROM/ stability prior to upcoming knee replacement.  See updated goals.     OBJECTIVE IMPAIRMENTS: Abnormal gait, decreased balance, decreased coordination, decreased mobility, difficulty walking, decreased ROM, decreased strength, decreased safety awareness, hypomobility, impaired flexibility, improper body mechanics, and pain.   ACTIVITY LIMITATIONS: carrying, lifting, bending, sitting, standing, squatting, stairs, transfers, bed mobility, and locomotion level  PARTICIPATION LIMITATIONS: community activity  PERSONAL FACTORS: Age, Fitness, and Social background are also affecting patient's functional outcome.   REHAB POTENTIAL: Good  CLINICAL DECISION MAKING: Stable/uncomplicated  EVALUATION COMPLEXITY: Low   GOALS: Goals reviewed with patient? Yes  SHORT TERM GOALS: Target date: 11/23/22 Pt. Independent with HEP to increase L knee AROM to >120 deg. With no pain to improve mobility/ walking Baseline:  see above Goal status: Initial   LONG TERM GOALS:  Target date: 12/21/22  Pt. Will increase FOTO to 52 to improve pain-free mobility.   Baseline: initial FOTO 38 Goal status: On-going  2.  Pt. Will reports <3/10 B knee pain at worst with daily walking/ household tasks.  Baseline: 10/10 pain at worst Goal status: On-going  3.  Pt. Will demonstrate more normalized gait pattern with initial steps after standing and during community mobility without assistive device.   Baseline: Antalgic gait without use of assistive device.  Marked improvement with use of SPC and instruction Goal status: Not met    PLAN:  PT FREQUENCY: 2x/week  PT DURATION: 8 weeks  PLANNED INTERVENTIONS: Therapeutic exercises, Therapeutic activity, Neuromuscular re-education, Balance training, Gait training, Patient/Family education, Self Care, Joint mobilization, Aquatic Therapy, Electrical stimulation, Cryotherapy, Moist heat, and Manual therapy  PLAN FOR NEXT SESSION: Issue new HEP  Pura Spice, PT, DPT # [id number] 11/06/2022, 10:45 AM

## 2022-11-09 ENCOUNTER — Ambulatory Visit: Payer: Medicare HMO | Admitting: Physical Therapy

## 2022-11-09 DIAGNOSIS — M6281 Muscle weakness (generalized): Secondary | ICD-10-CM

## 2022-11-09 DIAGNOSIS — R269 Unspecified abnormalities of gait and mobility: Secondary | ICD-10-CM

## 2022-11-09 DIAGNOSIS — G8929 Other chronic pain: Secondary | ICD-10-CM

## 2022-11-09 DIAGNOSIS — Z96651 Presence of right artificial knee joint: Secondary | ICD-10-CM

## 2022-11-11 ENCOUNTER — Ambulatory Visit: Payer: Medicare HMO | Admitting: Physical Therapy

## 2022-11-11 DIAGNOSIS — Z96651 Presence of right artificial knee joint: Secondary | ICD-10-CM

## 2022-11-11 DIAGNOSIS — G8929 Other chronic pain: Secondary | ICD-10-CM

## 2022-11-11 DIAGNOSIS — R269 Unspecified abnormalities of gait and mobility: Secondary | ICD-10-CM

## 2022-11-11 DIAGNOSIS — M6281 Muscle weakness (generalized): Secondary | ICD-10-CM

## 2022-11-16 ENCOUNTER — Ambulatory Visit: Payer: Medicare HMO | Admitting: Physical Therapy

## 2022-11-18 ENCOUNTER — Ambulatory Visit: Payer: Medicare HMO | Admitting: Physical Therapy

## 2022-11-19 NOTE — Therapy (Signed)
OUTPATIENT PHYSICAL THERAPY LOWER EXTREMITY TREATMENT   Patient Name: Melvin Johnson MRN: [id number] DOB:11/26/1940, 82 y.o., male Today's Date: 11/09/2022   PT End of Session - 11/19/22 1758     Visit Number 8    Number of Visits 23    Date for PT Re-Evaluation 12/21/22    PT Start Time 1646    PT Stop Time 1700    PT Time Calculation (min) 14 min             Past Medical History:  Diagnosis Date   Arthritis    At risk for sleep apnea    STOP-BANG=4        SENT TO PCP 06-26-2014   Coronary artery disease    no cardiologist for several years , sees pcp  dr mark Sabra Heck   History of MI (myocardial infarction)    may 1999   Hyperlipidemia    Hypertension    Mild acid reflux    Neuropathy of both feet    burning sensation   Nocturia    Prostate cancer ([place]) 03/2014   Gleason 3+3=6, seed implant 07/03/14   S/P CABG x 4    Seasonal allergies    Type 2 diabetes mellitus ([place])    Wears dentures    UPPER   Wears glasses    Past Surgical History:  Procedure Laterality Date   CARDIAC CATHETERIZATION  1999  ([place])   CATARACT EXTRACTION W/PHACO Right 03/02/2021   Procedure: CATARACT EXTRACTION PHACO AND INTRAOCULAR LENS PLACEMENT ([place]) RIGHT DIABETIC 5.79 00:45.2;  Surgeon: Eulogio Bear, MD;  Location: [place];  Service: Ophthalmology;  Laterality: Right;   CATARACT EXTRACTION W/PHACO Left 03/16/2021   Procedure: CATARACT EXTRACTION PHACO AND INTRAOCULAR LENS PLACEMENT ([facility]) LEFT DIABETIC 5.90 00:41.5;  Surgeon: Eulogio Bear, MD;  Location: [employer] [facility];  Service: Ophthalmology;  Laterality: Left;   CORONARY ARTERY BYPASS GRAFT  2003   dr vantright   4 VESSEL   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   RADIOACTIVE SEED IMPLANT N/A 07/03/2014   Procedure: RADIOACTIVE SEED IMPLANT;  Surgeon: Bernestine Amass, MD;  Location: [facility];  Service: Urology;  Laterality: N/A;   TONSILLECTOMY  as child   Patient Active Problem  List   Diagnosis Date Noted   Malignant neoplasm of prostate ([place]) 05/02/2014    PCP: Rusty Aus, MD   REFERRING PROVIDER: Lovell Sheehan, MD  REFERRING DIAG: History of total knee arthroplasty  THERAPY DIAG:  Hx of total knee replacement, right  Chronic pain of right knee  Chronic pain of left knee  Muscle weakness  Gait difficulty  Rationale for Evaluation and Treatment Rehabilitation  ONSET DATE: 04/01/22  SUBJECTIVE:   SUBJECTIVE STATEMENT:    EVALUATION Pt. S/p R TKA on 04/01/22 and received skilled PT services at home.  Pt. States he was doing well until the PT was pushing on R knee to increase flexion and pt. Experienced significant pain.  Pt. Had a recent fall while getting out car and catching foot.  Pt. Reports no injury, just soreness.  Pt. Currently c/o 10/10 R knee pain, esp. With flexion/ increase activity.    PERTINENT HISTORY: See MD notes  PAIN:  Are you having pain? Yes: NPRS scale: 10/10 Pain location: R knee joint Pain description: aching/ sharp Aggravating factors: flexion/ increase activity Relieving factors: rest  PRECAUTIONS: None  WEIGHT BEARING RESTRICTIONS: No  FALLS:  Has patient fallen in last 6 months?  Yes. Number of falls 1  LIVING ENVIRONMENT: Lives with: lives with their spouse Lives in: House/apartment Has following equipment at home: Single point cane  OCCUPATION: Retired, pts. Wife has AKA and requires use of w/c  PLOF: Independent  PATIENT GOALS: Increase R knee ROM/ LE strengthening to improve pain-free mobility.     OBJECTIVE:   DIAGNOSTIC FINDINGS: See [facility] noteds  PATIENT SURVEYS:  FOTO initial 38/ goal 65  COGNITION: Overall cognitive status: Within functional limits for tasks assessed     SENSATION: WFL  EDEMA:  Circumferential: L/R knee joint line:  39.5/41 cm./ mid-gastroc  36/36 cm.).    POSTURE: rounded shoulders and forward head  PALPATION: R knee joint line tenderness.  Moderate R  knee/ lower leg pitting edema as compared to L.    LOWER EXTREMITY ROM:  L/R knee: flexion (130 deg./ 107 deg.), extension (0 deg./ -2 deg.).   R knee flexion PROM: 117 deg. (15/10 pain reported).    LOWER EXTREMITY MMT:  MMT Right eval Left eval  Hip flexion 4/5 4/5  Hip extension 4/5 4/5  Hip abduction 4/5 4/5  Hip adduction    Hip internal rotation    Hip external rotation    Knee flexion 5/5 5/5  Knee extension 5/5 5/5  Ankle dorsiflexion    Ankle plantarflexion    Ankle inversion    Ankle eversion     (Blank rows = not tested)  LOWER EXTREMITY SPECIAL TESTS:  N/T  FUNCTIONAL TESTS:  5 times sit to stand: TBD SLS: <8 sec.   GAIT: Distance walked: in clinic Assistive device utilized: None Level of assistance: Complete Independence Comments: Slight R antalgic gait with initial steps after standing and increase distance walked.    11/7:  Reassessment of B hip strength grossly 4/5 MMT (flexion/ abduction/ adduction).  B knee strength grossly 5/5 MMT.     1/2: L knee AROM in supine:  -2 to 127 deg.  Hamstring length: 63 deg. (-) L LE SLR.  L knee PROM flexion: 130 deg. (Pain noted).   R knee AROM: -3 to 123 deg. (Marked improvement in R knee AROM over past couple months).   Standing lumbar flexion WFL/ extension 50% limited/ rotn. 25% limited.  Tenderness and hypomobility noted in lumbar spine (L/R).      TODAY'S TREATMENT      DATE: 11/09/22   No charge from tx. (Pts. Wife receiving PT for AKA)  Subjective:   Pt. Entered PT with marked improvement in gait pattern while using SPC.     There.ex.:  Nustep L2 15 min. B UE/LE.  Consistent cadence.    See new HEP (below)   PATIENT EDUCATION:  Education details: See HEP Person educated: Patient Education method: Explanation, Demonstration, and Handouts Education comprehension: verbalized understanding and returned demonstration  HOME EXERCISE PROGRAM: Access Code: [email or web address] URL:  [email or web address] Date: 11/19/2022 Prepared by: Dorcas Carrow  Exercises - Seated Long Arc Quad  - 1 x daily - 7 x weekly - 3 sets - 10 reps - Supine Heel Slides  - 1 x daily - 7 x weekly - 2 sets - 10 reps - Standing Hip Abduction  - 1 x daily - 7 x weekly - 2 sets - 10 reps - Mini Squats at Table  - 1 x daily - 7 x weekly - 2 sets - 10 reps - Standing Hip Extension  - 1 x daily - 7 x weekly - 2 sets - 10 reps - Supine Transversus Abdominis  Bracing - Hands on Stomach  - 1 x daily - 7 x weekly - 2 sets - 10 reps - Supine March  - 1 x daily - 7 x weekly - 2 sets - 10 reps   ASSESSMENT:  CLINICAL IMPRESSION:  Pt. Completed Nustep and supine there.ex. with SPT while pts. Wife worked on Personnel officer with prosthetic leg. Issued new HEP. No charge for PT tx. Session.     OBJECTIVE IMPAIRMENTS: Abnormal gait, decreased balance, decreased coordination, decreased mobility, difficulty walking, decreased ROM, decreased strength, decreased safety awareness, hypomobility, impaired flexibility, improper body mechanics, and pain.   ACTIVITY LIMITATIONS: carrying, lifting, bending, sitting, standing, squatting, stairs, transfers, bed mobility, and locomotion level  PARTICIPATION LIMITATIONS: community activity  PERSONAL FACTORS: Age, Fitness, and Social background are also affecting patient's functional outcome.   REHAB POTENTIAL: Good  CLINICAL DECISION MAKING: Stable/uncomplicated  EVALUATION COMPLEXITY: Low   GOALS: Goals reviewed with patient? Yes  SHORT TERM GOALS: Target date: 11/23/22 Pt. Independent with HEP to increase L knee AROM to >120 deg. With no pain to improve mobility/ walking Baseline:  see above Goal status: On-going   LONG TERM GOALS: Target date: 12/21/22  Pt. Will increase FOTO to 52 to improve pain-free mobility.   Baseline: initial FOTO 38 Goal status: On-going  2.  Pt. Will reports <3/10 B knee pain at worst with daily walking/ household  tasks.  Baseline: 10/10 pain at worst Goal status: On-going  3.  Pt. Will demonstrate more normalized gait pattern with initial steps after standing and during community mobility without assistive device.   Baseline: Antalgic gait without use of assistive device.  Marked improvement with use of SPC and instruction Goal status: Not met    PLAN:  PT FREQUENCY: 2x/week  PT DURATION: 8 weeks  PLANNED INTERVENTIONS: Therapeutic exercises, Therapeutic activity, Neuromuscular re-education, Balance training, Gait training, Patient/Family education, Self Care, Joint mobilization, Aquatic Therapy, Electrical stimulation, Cryotherapy, Moist heat, and Manual therapy  PLAN FOR NEXT SESSION: Review HEP  Pura Spice, PT, DPT # [id number] 11/19/2022, 5:59 PM

## 2022-11-20 NOTE — Therapy (Signed)
OUTPATIENT PHYSICAL THERAPY LOWER EXTREMITY TREATMENT   Patient Name: Melvin Johnson MRN: [id number] DOB:08/04/1941, 82 y.o., male Today's Date: 11/11/2022   PT End of Session - 11/19/22 1758     Visit Number 8    Number of Visits 23    Date for PT Re-Evaluation 12/21/22    PT Start Time 1646    PT Stop Time 1700    PT Time Calculation (min) 14 min             Past Medical History:  Diagnosis Date   Arthritis    At risk for sleep apnea    STOP-BANG=4        SENT TO PCP 06-26-2014   Coronary artery disease    no cardiologist for several years , sees pcp  dr mark Sabra Heck   History of MI (myocardial infarction)    may 1999   Hyperlipidemia    Hypertension    Mild acid reflux    Neuropathy of both feet    burning sensation   Nocturia    Prostate cancer ([place]) 03/2014   Gleason 3+3=6, seed implant 07/03/14   S/P CABG x 4    Seasonal allergies    Type 2 diabetes mellitus ([place])    Wears dentures    UPPER   Wears glasses    Past Surgical History:  Procedure Laterality Date   CARDIAC CATHETERIZATION  1999  ([place])   CATARACT EXTRACTION W/PHACO Right 03/02/2021   Procedure: CATARACT EXTRACTION PHACO AND INTRAOCULAR LENS PLACEMENT ([place]) RIGHT DIABETIC 5.79 00:45.2;  Surgeon: Eulogio Bear, MD;  Location: [facility];  Service: Ophthalmology;  Laterality: Right;   CATARACT EXTRACTION W/PHACO Left 03/16/2021   Procedure: CATARACT EXTRACTION PHACO AND INTRAOCULAR LENS PLACEMENT ([facility]) LEFT DIABETIC 5.90 00:41.5;  Surgeon: Eulogio Bear, MD;  Location: [facility];  Service: Ophthalmology;  Laterality: Left;   CORONARY ARTERY BYPASS GRAFT  2003   dr vantright   4 VESSEL   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   RADIOACTIVE SEED IMPLANT N/A 07/03/2014   Procedure: RADIOACTIVE SEED IMPLANT;  Surgeon: Bernestine Amass, MD;  Location: [facility];  Service: Urology;  Laterality: N/A;   TONSILLECTOMY  as child   Patient Active Problem  List   Diagnosis Date Noted   Malignant neoplasm of prostate ([place]) 05/02/2014    PCP: Rusty Aus, MD   REFERRING PROVIDER: Lovell Sheehan, MD  REFERRING DIAG: History of total knee arthroplasty  THERAPY DIAG:  Hx of total knee replacement, right  Chronic pain of right knee  Chronic pain of left knee  Muscle weakness  Gait difficulty  Rationale for Evaluation and Treatment Rehabilitation  ONSET DATE: 04/01/22  SUBJECTIVE:   SUBJECTIVE STATEMENT:    EVALUATION Pt. S/p R TKA on 04/01/22 and received skilled PT services at home.  Pt. States he was doing well until the PT was pushing on R knee to increase flexion and pt. Experienced significant pain.  Pt. Had a recent fall while getting out car and catching foot.  Pt. Reports no injury, just soreness.  Pt. Currently c/o 10/10 R knee pain, esp. With flexion/ increase activity.    PERTINENT HISTORY: See MD notes  PAIN:  Are you having pain? Yes: NPRS scale: 10/10 Pain location: R knee joint Pain description: aching/ sharp Aggravating factors: flexion/ increase activity Relieving factors: rest  PRECAUTIONS: None  WEIGHT BEARING RESTRICTIONS: No  FALLS:  Has patient fallen in last 6 months?  Yes. Number of falls 1  LIVING ENVIRONMENT: Lives with: lives with their spouse Lives in: House/apartment Has following equipment at home: Single point cane  OCCUPATION: Retired, pts. Wife has AKA and requires use of w/c  PLOF: Independent  PATIENT GOALS: Increase R knee ROM/ LE strengthening to improve pain-free mobility.     OBJECTIVE:   DIAGNOSTIC FINDINGS: See [facility] noteds  PATIENT SURVEYS:  FOTO initial 38/ goal 44  COGNITION: Overall cognitive status: Within functional limits for tasks assessed     SENSATION: WFL  EDEMA:  Circumferential: L/R knee joint line:  39.5/41 cm./ mid-gastroc  36/36 cm.).    POSTURE: rounded shoulders and forward head  PALPATION: R knee joint line tenderness.  Moderate R  knee/ lower leg pitting edema as compared to L.    LOWER EXTREMITY ROM:  L/R knee: flexion (130 deg./ 107 deg.), extension (0 deg./ -2 deg.).   R knee flexion PROM: 117 deg. (15/10 pain reported).    LOWER EXTREMITY MMT:  MMT Right eval Left eval  Hip flexion 4/5 4/5  Hip extension 4/5 4/5  Hip abduction 4/5 4/5  Hip adduction    Hip internal rotation    Hip external rotation    Knee flexion 5/5 5/5  Knee extension 5/5 5/5  Ankle dorsiflexion    Ankle plantarflexion    Ankle inversion    Ankle eversion     (Blank rows = not tested)  LOWER EXTREMITY SPECIAL TESTS:  N/T  FUNCTIONAL TESTS:  5 times sit to stand: TBD SLS: <8 sec.   GAIT: Distance walked: in clinic Assistive device utilized: None Level of assistance: Complete Independence Comments: Slight R antalgic gait with initial steps after standing and increase distance walked.    11/7:  Reassessment of B hip strength grossly 4/5 MMT (flexion/ abduction/ adduction).  B knee strength grossly 5/5 MMT.     1/2: L knee AROM in supine:  -2 to 127 deg.  Hamstring length: 63 deg. (-) L LE SLR.  L knee PROM flexion: 130 deg. (Pain noted).   R knee AROM: -3 to 123 deg. (Marked improvement in R knee AROM over past couple months).   Standing lumbar flexion WFL/ extension 50% limited/ rotn. 25% limited.  Tenderness and hypomobility noted in lumbar spine (L/R).      TODAY'S TREATMENT      DATE: 11/11/22   No charge from tx. (Pts. Wife receiving PT for AKA)  Subjective:   Pt. Reports being sore from HEP but no increase c/o pain.  Pt. Using SPC to improve gait pattern/ manage pain symptoms.       There.ex.:  Nustep L3 10+ min. B UE/LE.  Consistent cadence.    Reviewed HEP (no changes at this time).     PATIENT EDUCATION:  Education details: See HEP Person educated: Patient Education method: Explanation, Demonstration, and Handouts Education comprehension: verbalized understanding and returned demonstration  HOME  EXERCISE PROGRAM: Access Code: [email or web address] URL: [email or web address] Date: 11/19/2022 Prepared by: Dorcas Carrow  Exercises - Seated Long Arc Quad  - 1 x daily - 7 x weekly - 3 sets - 10 reps - Supine Heel Slides  - 1 x daily - 7 x weekly - 2 sets - 10 reps - Standing Hip Abduction  - 1 x daily - 7 x weekly - 2 sets - 10 reps - Mini Squats at Table  - 1 x daily - 7 x weekly - 2 sets - 10 reps - Standing Hip Extension  -  1 x daily - 7 x weekly - 2 sets - 10 reps - Supine Transversus Abdominis Bracing - Hands on Stomach  - 1 x daily - 7 x weekly - 2 sets - 10 reps - Supine March  - 1 x daily - 7 x weekly - 2 sets - 10 reps   ASSESSMENT:  CLINICAL IMPRESSION:  Pt. Completed Nustep and supine there.ex. with SPT while pts. Wife worked on Personnel officer with prosthetic leg. Issued new HEP. No charge for PT tx. Session.     OBJECTIVE IMPAIRMENTS: Abnormal gait, decreased balance, decreased coordination, decreased mobility, difficulty walking, decreased ROM, decreased strength, decreased safety awareness, hypomobility, impaired flexibility, improper body mechanics, and pain.   ACTIVITY LIMITATIONS: carrying, lifting, bending, sitting, standing, squatting, stairs, transfers, bed mobility, and locomotion level  PARTICIPATION LIMITATIONS: community activity  PERSONAL FACTORS: Age, Fitness, and Social background are also affecting patient's functional outcome.   REHAB POTENTIAL: Good  CLINICAL DECISION MAKING: Stable/uncomplicated  EVALUATION COMPLEXITY: Low   GOALS: Goals reviewed with patient? Yes  SHORT TERM GOALS: Target date: 11/23/22 Pt. Independent with HEP to increase L knee AROM to >120 deg. With no pain to improve mobility/ walking Baseline:  see above Goal status: On-going   LONG TERM GOALS: Target date: 12/21/22  Pt. Will increase FOTO to 52 to improve pain-free mobility.   Baseline: initial FOTO 38 Goal status: On-going  2.  Pt. Will reports <3/10 B knee  pain at worst with daily walking/ household tasks.  Baseline: 10/10 pain at worst Goal status: On-going  3.  Pt. Will demonstrate more normalized gait pattern with initial steps after standing and during community mobility without assistive device.   Baseline: Antalgic gait without use of assistive device.  Marked improvement with use of SPC and instruction Goal status: Not met    PLAN:  PT FREQUENCY: 2x/week  PT DURATION: 8 weeks  PLANNED INTERVENTIONS: Therapeutic exercises, Therapeutic activity, Neuromuscular re-education, Balance training, Gait training, Patient/Family education, Self Care, Joint mobilization, Aquatic Therapy, Electrical stimulation, Cryotherapy, Moist heat, and Manual therapy  PLAN FOR NEXT SESSION: Review HEP  Pura Spice, PT, DPT # [phone] 11/19/2022, 5:59 PM

## 2022-11-23 ENCOUNTER — Encounter: Payer: Medicare HMO | Admitting: Physical Therapy

## 2022-11-25 ENCOUNTER — Encounter: Payer: Medicare HMO | Admitting: Physical Therapy

## 2023-01-03 ENCOUNTER — Ambulatory Visit: Payer: Medicare HMO | Admitting: Physical Therapy

## 2023-01-06 ENCOUNTER — Encounter: Payer: Medicare HMO | Admitting: Physical Therapy

## 2023-01-11 ENCOUNTER — Encounter: Payer: Medicare HMO | Admitting: Physical Therapy

## 2023-01-13 ENCOUNTER — Encounter: Payer: Medicare HMO | Admitting: Physical Therapy

## 2023-01-18 ENCOUNTER — Encounter: Payer: Medicare HMO | Admitting: Physical Therapy

## 2023-01-20 ENCOUNTER — Encounter: Payer: Medicare HMO | Admitting: Physical Therapy

## 2023-01-25 ENCOUNTER — Ambulatory Visit: Payer: Medicare HMO | Admitting: Physical Therapy

## 2023-01-27 ENCOUNTER — Ambulatory Visit: Payer: Medicare HMO | Attending: Orthopedic Surgery | Admitting: Physical Therapy

## 2023-01-27 DIAGNOSIS — R269 Unspecified abnormalities of gait and mobility: Secondary | ICD-10-CM | POA: Diagnosis present

## 2023-01-27 DIAGNOSIS — M6281 Muscle weakness (generalized): Secondary | ICD-10-CM | POA: Insufficient documentation

## 2023-01-27 DIAGNOSIS — M25662 Stiffness of left knee, not elsewhere classified: Secondary | ICD-10-CM | POA: Insufficient documentation

## 2023-01-27 DIAGNOSIS — Z96652 Presence of left artificial knee joint: Secondary | ICD-10-CM | POA: Insufficient documentation

## 2023-01-27 DIAGNOSIS — Z96651 Presence of right artificial knee joint: Secondary | ICD-10-CM | POA: Insufficient documentation

## 2023-01-27 NOTE — Therapy (Signed)
OUTPATIENT PHYSICAL THERAPY LOWER EXTREMITY EVALUATION  Patient Name: Melvin CooleyKenneth L Maltz MRN: [id number] DOB:Jun 25, 1941, 82 y.o., male Today's Date: 01/27/2023  END OF SESSION:  PT End of Session - 01/27/23 1326     Visit Number 1    Number of Visits 16    Date for PT Re-Evaluation 03/24/23    PT Start Time 1028    PT Stop Time 1144    PT Time Calculation (min) 76 min             Past Medical History:  Diagnosis Date   Arthritis    At risk for sleep apnea    STOP-BANG=4        SENT TO PCP 06-26-2014   Coronary artery disease    no cardiologist for several years , sees pcp  dr mark Hyacinth Meekermiller   History of MI (myocardial infarction)    may 1999   Hyperlipidemia    Hypertension    Mild acid reflux    Neuropathy of both feet    burning sensation   Nocturia    Prostate cancer ([facility]) 03/2014   Gleason 3+3=6, seed implant 07/03/14   S/P CABG x 4    Seasonal allergies    Type 2 diabetes mellitus ([facility])    Wears dentures    UPPER   Wears glasses    Past Surgical History:  Procedure Laterality Date   CARDIAC CATHETERIZATION  1999  ([place])   CATARACT EXTRACTION W/PHACO Right 03/02/2021   Procedure: CATARACT EXTRACTION PHACO AND INTRAOCULAR LENS PLACEMENT ([facility]) RIGHT DIABETIC 5.79 00:45.2;  Surgeon: Nevada CraneKing, Bradley Mark, MD;  [facility];  Service: Ophthalmology;  Laterality: Right;   CATARACT EXTRACTION W/PHACO Left 03/16/2021   Procedure: CATARACT EXTRACTION PHACO AND INTRAOCULAR LENS PLACEMENT ([facility]) LEFT DIABETIC 5.90 00:41.5;  Surgeon: Nevada CraneKing, Bradley Mark, MD;  [facility];  Service: Ophthalmology;  Laterality: Left;   CORONARY ARTERY BYPASS GRAFT  2003   dr vantright   4 VESSEL   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   RADIOACTIVE SEED IMPLANT N/A 07/03/2014   Procedure: RADIOACTIVE SEED IMPLANT;  Surgeon: Valetta Fulleravid S Grapey, MD;  [facility];  Service: Urology;  Laterality: N/A;   TONSILLECTOMY  as child   Patient  Active Problem List   Diagnosis Date Noted   Malignant neoplasm of prostate 05/02/2014    PCP: Danella PentonMiller, Mark F, MD  REFERRING PROVIDER: Lyndle HerrlichBowers, James R, MD  REFERRING DIAG: L total knee replacement  THERAPY DIAG:  History of total knee replacement, left  Joint stiffness of knee, left  Muscle weakness  Gait difficulty  Rationale for Evaluation and Treatment: Rehabilitation  ONSET DATE: 01/20/23 (surgery date).    SUBJECTIVE:   SUBJECTIVE STATEMENT: Pt. S/p L TKA on 01/20/23.  Pt. Known well to PT clinic.  Pt. States he is in a lot of pain (8/10) and is having more pain in s/p L knee surgery than he did during R TKA.    PERTINENT HISTORY: See MD notes. PAIN:  Are you having pain? Yes: NPRS scale: 8/10 Pain location: L knee Pain description: burning Aggravating factors: knee flexion/ walking Relieving factors: ice/ medications  PRECAUTIONS: None  WEIGHT BEARING RESTRICTIONS: No  FALLS:  Has patient fallen in last 6 months? Yes. Number of falls 1 (no injury)  LIVING ENVIRONMENT: Lives with: lives with their spouse Lives in: House/apartment Has following equipment at home: Single point cane and Environmental consultantWalker - 2 wheeled  OCCUPATION: retired  PLOF: Independent  PATIENT GOALS: Increase L knee ROM/ strength and walking pain-free  NEXT MD VISIT: 02/01/23  OBJECTIVE:   PATIENT SURVEYS:  FOTO initial 27/ goal 87  COGNITION: Overall cognitive status: Within functional limits for tasks assessed     SENSATION: WFL  EDEMA:  Circumferential: L/R joint line (45.5/39 cm), 2" superior (47/40.5 cm), mid-gastroc (36.4/34.5 cm).  L knee bandages in place.    MUSCLE LENGTH: Hamstrings: Right 55 deg; Left 50 deg  POSTURE: rounded shoulders  PALPATION: Significant tenderness around L knee/ inner thigh (bruising).  No calf tenderness.  LOWER EXTREMITY ROM:  Active ROM Right eval Left eval  Hip flexion [facility]  Hip extension    Hip abduction    Hip adduction    Hip  internal rotation    Hip external rotation    Knee flexion 119 deg. 82 deg.  Knee extension -2 deg. -4 deg.  Ankle dorsiflexion    Ankle plantarflexion    Ankle inversion    Ankle eversion     (Blank rows = not tested)  L knee AA/PROM: 94 deg.  LOWER EXTREMITY MMT:  MMT Right eval Left eval  Hip flexion 4/5 4/5  Hip extension    Hip abduction 4/5 4/5  Hip adduction    Hip internal rotation    Hip external rotation    Knee flexion 5/5 4/5  Knee extension 5/5 3/5  Ankle dorsiflexion    Ankle plantarflexion    Ankle inversion    Ankle eversion     (Blank rows = not tested)  LOWER EXTREMITY SPECIAL TESTS:  N/A  FUNCTIONAL TESTS:  5 times sit to stand: TBD  GAIT: Distance walked: in clinic Assistive device utilized: Walker - 2 wheeled Level of assistance: SBA Comments: L antalgic gait with limited L hip/knee flexion during swing through phase of gait.   TODAY'S TREATMENT:                                                                                                                              DATE: 01/26/22   Evaluation/ See HEP  PATIENT EDUCATION:  Education details: [email or web address] Person educated: Patient Education method: Explanation, Demonstration, and Handouts Education comprehension: verbalized understanding and returned demonstration  HOME EXERCISE PROGRAM: Access Code: [email or web address] URL: [email or web address] Date: 01/27/2023 Prepared by: Dorene Grebe Exercises - Supine Quad Set - 2 x daily - 7 x weekly - 1 sets - 10 reps - Supine Heel Slide with Strap - 2 x daily - 7 x weekly - 1 sets - 10 reps - Seated Long Arc Quad - 2 x daily - 7 x weekly - 1 sets - 10 reps - Seated Knee Flexion Extension AAROM with Overpressure - 2 x daily - 7 x weekly - 1 sets - 10 reps - Small Range Straight Leg Raise - 2 x daily - 7 x weekly - 1 sets - 10 reps   ASSESSMENT:  CLINICAL IMPRESSION: Patient is a pleasant 81  y.o. male who was seen today for physical therapy  evaluation and treatment for L TKA.  Pt. Reports significant pain with L knee ROM/ standing/walking with RW.  Pt. Presents with limited L knee flexion (82 deg. AROM in supine position).  Pt. Understands importance of L knee ROM/ strengthening and understands HEP.  Pt. Will benefit from skilled PT services to increase L knee ROM/ strength to improve pain-free mobility.    OBJECTIVE IMPAIRMENTS: Abnormal gait, decreased activity tolerance, decreased balance, decreased endurance, decreased mobility, difficulty walking, decreased ROM, decreased strength, hypomobility, impaired flexibility, improper body mechanics, and pain.   ACTIVITY LIMITATIONS: carrying, lifting, bending, standing, squatting, stairs, transfers, locomotion level, and caring for others  PARTICIPATION LIMITATIONS: community activity, yard work, and church  PERSONAL FACTORS: Age and Past/current experiences are also affecting patient's functional outcome.   REHAB POTENTIAL: Good  CLINICAL DECISION MAKING: Evolving/moderate complexity  EVALUATION COMPLEXITY: Moderate   GOALS: Goals reviewed with patient? Yes  SHORT TERM GOALS: Target date: 02/17/23 Pt. Independent with HEP to increase L knee AROM (0 to >115 deg.) to improve functional mobility.   Baseline:  see above Goal status: INITIAL  LONG TERM GOALS: Target date: 03/24/23  Pt. Will increase FOTO to 61 to improve pain-free mobility.  Baseline: initial 27 Goal status: INITIAL  2.  Pt. Able to ambulate community distance with more normalized gait pattern and on assistive device safely.   Baseline:  L antalgic gait with RW Goal status: INITIAL  3.  Pt. Able to manage wife's w/c into and out of trunk of car to be able to attend church/ events.   Baseline: unable to lift w/c at this time Goal status: INITIAL  PLAN:  PT FREQUENCY: 2x/week  PT DURATION: 8 weeks  PLANNED INTERVENTIONS: Therapeutic exercises, Therapeutic activity, Neuromuscular re-education, Balance  training, Gait training, Patient/Family education, Self Care, Joint mobilization, Electrical stimulation, Cryotherapy, Moist heat, scar mobilization, Manual therapy, and Re-evaluation  PLAN FOR NEXT SESSION: Progress L knee ROM  Cammie Mcgee, PT, DPT # [phone] 01/27/2023, 1:28 PM

## 2023-01-29 ENCOUNTER — Encounter: Payer: Self-pay | Admitting: Physical Therapy

## 2023-02-01 ENCOUNTER — Ambulatory Visit: Payer: Medicare HMO | Admitting: Physical Therapy

## 2023-02-01 DIAGNOSIS — Z96652 Presence of left artificial knee joint: Secondary | ICD-10-CM | POA: Diagnosis not present

## 2023-02-01 DIAGNOSIS — M6281 Muscle weakness (generalized): Secondary | ICD-10-CM

## 2023-02-01 DIAGNOSIS — M25662 Stiffness of left knee, not elsewhere classified: Secondary | ICD-10-CM

## 2023-02-01 DIAGNOSIS — R269 Unspecified abnormalities of gait and mobility: Secondary | ICD-10-CM

## 2023-02-01 NOTE — Therapy (Signed)
OUTPATIENT PHYSICAL THERAPY LOWER EXTREMITY TREATMENT  Patient Name: Melvin CooleyKenneth L Johnson MRN: [id number] DOB:12-Jun-1941, 82 y.o., male Today's Date: 02/01/2023  END OF SESSION:  PT End of Session - 02/01/23 1927     Visit Number 2    Number of Visits 16    Date for PT Re-Evaluation 03/24/23    PT Start Time 1026    PT Stop Time 1130    PT Time Calculation (min) 64 min             Past Medical History:  Diagnosis Date   Arthritis    At risk for sleep apnea    STOP-BANG=4        SENT TO PCP 06-26-2014   Coronary artery disease    no cardiologist for several years , sees pcp  dr mark Hyacinth Meekermiller   History of MI (myocardial infarction)    may 1999   Hyperlipidemia    Hypertension    Mild acid reflux    Neuropathy of both feet    burning sensation   Nocturia    Prostate cancer 03/2014   Gleason 3+3=6, seed implant 07/03/14   S/P CABG x 4    Seasonal allergies    Type 2 diabetes mellitus    Wears dentures    UPPER   Wears glasses    Past Surgical History:  Procedure Laterality Date   CARDIAC CATHETERIZATION  1999  ([place])   CATARACT EXTRACTION W/PHACO Right 03/02/2021   Procedure: CATARACT EXTRACTION PHACO AND INTRAOCULAR LENS PLACEMENT (IOC) RIGHT DIABETIC 5.79 00:45.2;  Surgeon: Nevada CraneKing, Bradley Mark, MD;  [facility];  Service: Ophthalmology;  Laterality: Right;   CATARACT EXTRACTION W/PHACO Left 03/16/2021   Procedure: CATARACT EXTRACTION PHACO AND INTRAOCULAR LENS PLACEMENT (IOC) LEFT DIABETIC 5.90 00:41.5;  Surgeon: Nevada CraneKing, Bradley Mark, MD;  [facility];  Service: Ophthalmology;  Laterality: Left;   CORONARY ARTERY BYPASS GRAFT  2003   dr vantright   4 VESSEL   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   RADIOACTIVE SEED IMPLANT N/A 07/03/2014   Procedure: RADIOACTIVE SEED IMPLANT;  Surgeon: Valetta Fulleravid S Grapey, MD;  [facility];  Service: Urology;  Laterality: N/A;   TONSILLECTOMY  as child   Patient Active Problem  List   Diagnosis Date Noted   Malignant neoplasm of prostate 05/02/2014    PCP: Danella PentonMiller, Mark F, MD  REFERRING PROVIDER: Lyndle HerrlichBowers, James R, MD  REFERRING DIAG: L total knee replacement  THERAPY DIAG:  History of total knee replacement, left  Joint stiffness of knee, left  Muscle weakness  Gait difficulty  Rationale for Evaluation and Treatment: Rehabilitation  ONSET DATE: 01/20/23 (surgery date).    SUBJECTIVE:   SUBJECTIVE STATEMENT:   EVALUATION Pt. S/p L TKA on 01/20/23.  Pt. Known well to PT clinic.  Pt. States he is in a lot of pain (8/10) and is having more pain in s/p L knee surgery than he did during R TKA.    PERTINENT HISTORY: See MD notes. PAIN:  Are you having pain? Yes: NPRS scale: 8/10 Pain location: L knee Pain description: burning Aggravating factors: knee flexion/ walking Relieving factors: ice/ medications  PRECAUTIONS: None  WEIGHT BEARING RESTRICTIONS: No  FALLS:  Has patient fallen in last 6 months? Yes. Number of falls 1 (no injury)  LIVING ENVIRONMENT: Lives with: lives with their spouse Lives in: House/apartment Has following equipment at home: Single point cane and Environmental consultantWalker - 2 wheeled  OCCUPATION: retired  PLOF:  Independent  PATIENT GOALS: Increase L knee ROM/ strength and walking pain-free  NEXT MD VISIT: 02/01/23  OBJECTIVE:   PATIENT SURVEYS:  FOTO initial 27/ goal 53  COGNITION: Overall cognitive status: Within functional limits for tasks assessed     SENSATION: WFL  EDEMA:  Circumferential: L/R joint line (45.5/39 cm), 2" superior (47/40.5 cm), mid-gastroc (36.4/34.5 cm).  L knee bandages in place.    MUSCLE LENGTH: Hamstrings: Right 55 deg; Left 50 deg  POSTURE: rounded shoulders  PALPATION: Significant tenderness around L knee/ inner thigh (bruising).  No calf tenderness.  LOWER EXTREMITY ROM:  Active ROM Right eval Left eval  Hip flexion [facility]  Hip extension    Hip abduction    Hip adduction    Hip  internal rotation    Hip external rotation    Knee flexion 119 deg. 82 deg.  Knee extension -2 deg. -4 deg.  Ankle dorsiflexion    Ankle plantarflexion    Ankle inversion    Ankle eversion     (Blank rows = not tested)  L knee AA/PROM: 94 deg.  LOWER EXTREMITY MMT:  MMT Right eval Left eval  Hip flexion 4/5 4/5  Hip extension    Hip abduction 4/5 4/5  Hip adduction    Hip internal rotation    Hip external rotation    Knee flexion 5/5 4/5  Knee extension 5/5 3/5  Ankle dorsiflexion    Ankle plantarflexion    Ankle inversion    Ankle eversion     (Blank rows = not tested)  LOWER EXTREMITY SPECIAL TESTS:  N/A  FUNCTIONAL TESTS:  5 times sit to stand: TBD  GAIT: Distance walked: in clinic Assistive device utilized: Walker - 2 wheeled Level of assistance: SBA Comments: L antalgic gait with limited L hip/knee flexion during swing through phase of gait.   TODAY'S TREATMENT:                                                                                                                              DATE: 02/01/2023   Subjective:  There.ex.:  Manual tx.:   PATIENT EDUCATION:  Education details: [email or web address] Person educated: Patient Education method: Explanation, Demonstration, and Handouts Education comprehension: verbalized understanding and returned demonstration  HOME EXERCISE PROGRAM: Access Code: [email or web address] URL: [email or web address] Date: 01/27/2023 Prepared by: Dorene Grebe Exercises - Supine Quad Set - 2 x daily - 7 x weekly - 1 sets - 10 reps - Supine Heel Slide with Strap - 2 x daily - 7 x weekly - 1 sets - 10 reps - Seated Long Arc Quad - 2 x daily - 7 x weekly - 1 sets - 10 reps - Seated Knee Flexion Extension AAROM with Overpressure - 2 x daily - 7 x weekly - 1 sets - 10 reps - Small Range Straight Leg Raise - 2 x daily - 7 x weekly - 1 sets - 10 reps   ASSESSMENT:  CLINICAL  IMPRESSION: Patient is a pleasant 82 y.o. male who was seen today  for physical therapy evaluation and treatment for L TKA.  Pt. Reports significant pain with L knee ROM/ standing/walking with RW.  Pt. Presents with limited L knee flexion (82 deg. AROM in supine position).  Pt. Understands importance of L knee ROM/ strengthening and understands HEP.  Pt. Will benefit from skilled PT services to increase L knee ROM/ strength to improve pain-free mobility.    OBJECTIVE IMPAIRMENTS: Abnormal gait, decreased activity tolerance, decreased balance, decreased endurance, decreased mobility, difficulty walking, decreased ROM, decreased strength, hypomobility, impaired flexibility, improper body mechanics, and pain.   ACTIVITY LIMITATIONS: carrying, lifting, bending, standing, squatting, stairs, transfers, locomotion level, and caring for others  PARTICIPATION LIMITATIONS: community activity, yard work, and church  PERSONAL FACTORS: Age and Past/current experiences are also affecting patient's functional outcome.   REHAB POTENTIAL: Good  CLINICAL DECISION MAKING: Evolving/moderate complexity  EVALUATION COMPLEXITY: Moderate   GOALS: Goals reviewed with patient? Yes  SHORT TERM GOALS: Target date: 02/17/23 Pt. Independent with HEP to increase L knee AROM (0 to >115 deg.) to improve functional mobility.   Baseline:  see above Goal status: INITIAL  LONG TERM GOALS: Target date: 03/24/23  Pt. Will increase FOTO to 61 to improve pain-free mobility.  Baseline: initial 27 Goal status: INITIAL  2.  Pt. Able to ambulate community distance with more normalized gait pattern and on assistive device safely.   Baseline:  L antalgic gait with RW Goal status: INITIAL  3.  Pt. Able to manage wife's w/c into and out of trunk of car to be able to attend church/ events.   Baseline: unable to lift w/c at this time Goal status: INITIAL  PLAN:  PT FREQUENCY: 2x/week  PT DURATION: 8 weeks  PLANNED INTERVENTIONS: Therapeutic exercises, Therapeutic activity, Neuromuscular  re-education, Balance training, Gait training, Patient/Family education, Self Care, Joint mobilization, Electrical stimulation, Cryotherapy, Moist heat, scar mobilization, Manual therapy, and Re-evaluation  PLAN FOR NEXT SESSION: Progress L knee ROM  Cammie Mcgee, PT, DPT # [phone] 02/01/2023, 7:28 PM

## 2023-02-03 ENCOUNTER — Ambulatory Visit: Payer: Medicare HMO | Admitting: Physical Therapy

## 2023-02-03 DIAGNOSIS — Z96651 Presence of right artificial knee joint: Secondary | ICD-10-CM

## 2023-02-03 DIAGNOSIS — R269 Unspecified abnormalities of gait and mobility: Secondary | ICD-10-CM

## 2023-02-03 DIAGNOSIS — M25662 Stiffness of left knee, not elsewhere classified: Secondary | ICD-10-CM

## 2023-02-03 DIAGNOSIS — Z96652 Presence of left artificial knee joint: Secondary | ICD-10-CM | POA: Diagnosis not present

## 2023-02-03 DIAGNOSIS — M6281 Muscle weakness (generalized): Secondary | ICD-10-CM

## 2023-02-03 NOTE — Therapy (Signed)
OUTPATIENT PHYSICAL THERAPY LOWER EXTREMITY TREATMENT  Patient Name: Melvin CooleyKenneth L Johnson MRN: [id number] DOB:[birth year], 82 y.o., male Today's Date: 02/03/2023  END OF SESSION:  PT End of Session - 02/03/23 1217     Visit Number 3    Number of Visits 16    Date for PT Re-Evaluation 03/24/23    PT Start Time 1014    PT Stop Time 1115    PT Time Calculation (min) 61 min             Past Medical History:  Diagnosis Date   Arthritis    At risk for sleep apnea    STOP-BANG=4        SENT TO PCP 06-26-2014   Coronary artery disease    no cardiologist for several years , sees pcp  dr mark Hyacinth Meekermiller   History of MI (myocardial infarction)    may 1999   Hyperlipidemia    Hypertension    Mild acid reflux    Neuropathy of both feet    burning sensation   Nocturia    Prostate cancer 03/2014   Gleason 3+3=6, seed implant 07/03/14   S/P CABG x 4    Seasonal allergies    Type 2 diabetes mellitus    Wears dentures    UPPER   Wears glasses    Past Surgical History:  Procedure Laterality Date   CARDIAC CATHETERIZATION  1999  ([place])   CATARACT EXTRACTION W/PHACO Right 03/02/2021   Procedure: CATARACT EXTRACTION PHACO AND INTRAOCULAR LENS PLACEMENT (IOC) RIGHT DIABETIC 5.79 00:45.2;  Surgeon: [place] CraneKing, Bradley Mark, MD;  [facility];  Service: Ophthalmology;  Laterality: Right;   CATARACT EXTRACTION W/PHACO Left 03/16/2021   Procedure: CATARACT EXTRACTION PHACO AND INTRAOCULAR LENS PLACEMENT (IOC) LEFT DIABETIC 5.90 00:41.5;  Surgeon: Nevada CraneKing, Bradley Mark, MD;  [facility];  Service: Ophthalmology;  Laterality: Left;   CORONARY ARTERY BYPASS GRAFT  2003   dr vantright   4 VESSEL   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   RADIOACTIVE SEED IMPLANT N/A 07/03/2014   Procedure: RADIOACTIVE SEED IMPLANT;  Surgeon: Valetta Fulleravid S Grapey, MD;  [facility] [place];  Service: Urology;  Laterality: N/A;   TONSILLECTOMY  as child   Patient Active Problem  List   Diagnosis Date Noted   Malignant neoplasm of prostate 05/02/2014    PCP: Danella PentonMiller, Mark F, MD  REFERRING PROVIDER: Lyndle HerrlichBowers, James R, MD  REFERRING DIAG: L total knee replacement  THERAPY DIAG:  History of total knee replacement, left  Joint stiffness of knee, left  Muscle weakness  Gait difficulty  Hx of total knee replacement, right  Rationale for Evaluation and Treatment: Rehabilitation  ONSET DATE: 01/20/23 (surgery date).    SUBJECTIVE:   SUBJECTIVE STATEMENT:   EVALUATION Pt. S/p L TKA on 01/20/23.  Pt. Known well to PT clinic.  Pt. States he is in a lot of pain (8/10) and is having more pain in s/p L knee surgery than he did during R TKA.    PERTINENT HISTORY: See MD notes. PAIN:  Are you having pain? Yes: NPRS scale: 8/10 Pain location: L knee Pain description: burning Aggravating factors: knee flexion/ walking Relieving factors: ice/ medications  PRECAUTIONS: None  WEIGHT BEARING RESTRICTIONS: No  FALLS:  Has patient fallen in last 6 months? Yes. Number of falls 1 (no injury)  LIVING ENVIRONMENT: Lives with: lives with their spouse Lives in: House/apartment Has following equipment at home: Single point cane and Environmental consultantWalker -  2 wheeled  OCCUPATION: retired  PLOF: Independent  PATIENT GOALS: Increase L knee ROM/ strength and walking pain-free  NEXT MD VISIT: 02/01/23  OBJECTIVE:   PATIENT SURVEYS:  FOTO initial 27/ goal 19  COGNITION: Overall cognitive status: Within functional limits for tasks assessed     SENSATION: WFL  EDEMA:  Circumferential: L/R joint line (45.5/39 cm), 2" superior (47/40.5 cm), mid-gastroc (36.4/34.5 cm).  L knee bandages in place.    MUSCLE LENGTH: Hamstrings: Right 55 deg; Left 50 deg  POSTURE: rounded shoulders  PALPATION: Significant tenderness around L knee/ inner thigh (bruising).  No calf tenderness.  LOWER EXTREMITY ROM:  Active ROM Right eval Left eval  Hip flexion [facility]  Hip extension    Hip  abduction    Hip adduction    Hip internal rotation    Hip external rotation    Knee flexion 119 deg. 82 deg.  Knee extension -2 deg. -4 deg.  Ankle dorsiflexion    Ankle plantarflexion    Ankle inversion    Ankle eversion     (Blank rows = not tested)  L knee AA/PROM: 94 deg.  LOWER EXTREMITY MMT:  MMT Right eval Left eval  Hip flexion 4/5 4/5  Hip extension    Hip abduction 4/5 4/5  Hip adduction    Hip internal rotation    Hip external rotation    Knee flexion 5/5 4/5  Knee extension 5/5 3/5  Ankle dorsiflexion    Ankle plantarflexion    Ankle inversion    Ankle eversion     (Blank rows = not tested)  LOWER EXTREMITY SPECIAL TESTS:  N/A  FUNCTIONAL TESTS:  5 times sit to stand: TBD  GAIT: Distance walked: in clinic Assistive device utilized: Walker - 2 wheeled Level of assistance: SBA Comments: L antalgic gait with limited L hip/knee flexion during swing through phase of gait.   TODAY'S TREATMENT:                                                                                                                              DATE: 02/03/2023   Subjective: Pt. Arrived to PT with staples removed and steristrips in place.  Pt. Continues to use RW for safety/ pain mgmt.  Pt. Reports no new complaints.  Pt. Still not sleeping well at night.    There.ex.:  Nustep L1 10 min. (Seat 9-7) with B UE/LE.    Supine L knee ex.: heel slides/ quad sets with manual feedback/ SLR/ SAQ/ glut sets/ marching/ bolster bridging 20x each.    Standing marching/ hip abduction at //-bars with mirror feedback 20x each.  Seated LAQ/ marching 20x.  Standing heel raises 20x.    Sit to stands from gray chair with L knee flexion during sitting.  Pt. Able to sit in car backseat with L knee flexed today.    Walking in clinic/ to car with use of SPC and 2-point gait pattern.  Working  on consistent L hip flexion/ step pattern/ heel strike.   Reviewed HEP (no changes).  Manual tx.:   Supine  L patellar mobs (all planes)- good incision healing/ steristrips in place.  Supine L knee AA/PROM flexion and extension 5x each with holds.  L knee/ thigh/ calf STM in supine.    PATIENT EDUCATION:  Education details: [email or web address] Person educated: Patient Education method: Explanation, Demonstration, and Handouts Education comprehension: verbalized understanding and returned demonstration  HOME EXERCISE PROGRAM: Access Code: [email or web address] URL: [email or web address] Date: 01/27/2023 Prepared by: Dorene Grebe Exercises - Supine Quad Set - 2 x daily - 7 x weekly - 1 sets - 10 reps - Supine Heel Slide with Strap - 2 x daily - 7 x weekly - 1 sets - 10 reps - Seated Long Arc Quad - 2 x daily - 7 x weekly - 1 sets - 10 reps - Seated Knee Flexion Extension AAROM with Overpressure - 2 x daily - 7 x weekly - 1 sets - 10 reps - Small Range Straight Leg Raise - 2 x daily - 7 x weekly - 1 sets - 10 reps   ASSESSMENT:  CLINICAL IMPRESSION: Pt. Progressing to use of SPC with consistent gait pattern.  Pt. Will wean off use of RW this weekend to use of SPC.  Good incision healing noted and steristrips in place.  Pt. Understands importance of L knee ROM/ strengthening and understands HEP.  Pt. Will benefit from skilled PT services to increase L knee ROM/ strength to improve pain-free mobility.    OBJECTIVE IMPAIRMENTS: Abnormal gait, decreased activity tolerance, decreased balance, decreased endurance, decreased mobility, difficulty walking, decreased ROM, decreased strength, hypomobility, impaired flexibility, improper body mechanics, and pain.   ACTIVITY LIMITATIONS: carrying, lifting, bending, standing, squatting, stairs, transfers, locomotion level, and caring for others  PARTICIPATION LIMITATIONS: community activity, yard work, and church  PERSONAL FACTORS: Age and Past/current experiences are also affecting patient's functional outcome.   REHAB POTENTIAL: Good  CLINICAL DECISION MAKING:  Evolving/moderate complexity  EVALUATION COMPLEXITY: Moderate   GOALS: Goals reviewed with patient? Yes  SHORT TERM GOALS: Target date: 02/17/23 Pt. Independent with HEP to increase L knee AROM (0 to >115 deg.) to improve functional mobility.   Baseline:  see above Goal status: INITIAL  LONG TERM GOALS: Target date: 03/24/23  Pt. Will increase FOTO to 61 to improve pain-free mobility.  Baseline: initial 27 Goal status: INITIAL  2.  Pt. Able to ambulate community distance with more normalized gait pattern and on assistive device safely.   Baseline:  L antalgic gait with RW Goal status: INITIAL  3.  Pt. Able to manage wife's w/c into and out of trunk of car to be able to attend church/ events.   Baseline: unable to lift w/c at this time Goal status: INITIAL  PLAN:  PT FREQUENCY: 2x/week  PT DURATION: 8 weeks  PLANNED INTERVENTIONS: Therapeutic exercises, Therapeutic activity, Neuromuscular re-education, Balance training, Gait training, Patient/Family education, Self Care, Joint mobilization, Electrical stimulation, Cryotherapy, Moist heat, scar mobilization, Manual therapy, and Re-evaluation  PLAN FOR NEXT SESSION: Progress L knee ROM.  Issue standing LE ex.  Progress to [facility]  Cammie Mcgee, PT, DPT # [id number] 02/03/2023, 12:21 PM

## 2023-02-08 ENCOUNTER — Ambulatory Visit: Payer: Medicare HMO | Admitting: Physical Therapy

## 2023-02-10 ENCOUNTER — Ambulatory Visit: Payer: Medicare HMO | Admitting: Physical Therapy

## 2023-02-10 ENCOUNTER — Encounter: Payer: Self-pay | Admitting: Physical Therapy

## 2023-02-10 DIAGNOSIS — M6281 Muscle weakness (generalized): Secondary | ICD-10-CM

## 2023-02-10 DIAGNOSIS — M25662 Stiffness of left knee, not elsewhere classified: Secondary | ICD-10-CM

## 2023-02-10 DIAGNOSIS — Z96652 Presence of left artificial knee joint: Secondary | ICD-10-CM | POA: Diagnosis not present

## 2023-02-10 DIAGNOSIS — R269 Unspecified abnormalities of gait and mobility: Secondary | ICD-10-CM

## 2023-02-10 NOTE — Therapy (Signed)
OUTPATIENT PHYSICAL THERAPY LOWER EXTREMITY TREATMENT  Patient Name: Melvin Johnson MRN: [id number] DOB:1941-03-20, 82 y.o., male Today's Date: 02/10/2023  END OF SESSION:  PT End of Session - 02/10/23 1027     Visit Number 4    Number of Visits 16    Date for PT Re-Evaluation 03/24/23    PT Start Time 1027    PT Stop Time 1138    PT Time Calculation (min) 71 min             Past Medical History:  Diagnosis Date   Arthritis    At risk for sleep apnea    STOP-BANG=4        SENT TO PCP 06-26-2014   Coronary artery disease    no cardiologist for several years , sees pcp  dr mark Hyacinth Meeker   History of MI (myocardial infarction)    may 1999   Hyperlipidemia    Hypertension    Mild acid reflux    Neuropathy of both feet    burning sensation   Nocturia    Prostate cancer 03/2014   Gleason 3+3=6, seed implant 07/03/14   S/P CABG x 4    Seasonal allergies    Type 2 diabetes mellitus    Wears dentures    UPPER   Wears glasses    Past Surgical History:  Procedure Laterality Date   CARDIAC CATHETERIZATION  1999  ([place])   CATARACT EXTRACTION W/PHACO Right 03/02/2021   Procedure: CATARACT EXTRACTION PHACO AND INTRAOCULAR LENS PLACEMENT (IOC) RIGHT DIABETIC 5.79 00:45.2;  Surgeon: Nevada Crane, MD;  Location: [facility];  Service: Ophthalmology;  Laterality: Right;   CATARACT EXTRACTION W/PHACO Left 03/16/2021   Procedure: CATARACT EXTRACTION PHACO AND INTRAOCULAR LENS PLACEMENT (IOC) LEFT DIABETIC 5.90 00:41.5;  Surgeon: Nevada Crane, MD;  Location: [facility];  Service: Ophthalmology;  Laterality: Left;   CORONARY ARTERY BYPASS GRAFT  2003   dr vantright   4 VESSEL   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   RADIOACTIVE SEED IMPLANT N/A 07/03/2014   Procedure: RADIOACTIVE SEED IMPLANT;  Surgeon: Valetta Fuller, MD;  Location: [facility];  Service: Urology;  Laterality: N/A;   TONSILLECTOMY  as child   Patient Active Problem  List   Diagnosis Date Noted   Malignant neoplasm of prostate 05/02/2014    PCP: Danella Penton, MD  REFERRING PROVIDER: Lyndle Herrlich, MD  REFERRING DIAG: L total knee replacement  THERAPY DIAG:  History of total knee replacement, left  Joint stiffness of knee, left  Muscle weakness  Gait difficulty  Rationale for Evaluation and Treatment: Rehabilitation  ONSET DATE: 01/20/23 (surgery date).    SUBJECTIVE:   SUBJECTIVE STATEMENT:   EVALUATION Pt. S/p L TKA on 01/20/23.  Pt. Known well to PT clinic.  Pt. States he is in a lot of pain (8/10) and is having more pain in s/p L knee surgery than he did during R TKA.    PERTINENT HISTORY: See MD notes. PAIN:  Are you having pain? Yes: NPRS scale: 8/10 Pain location: L knee Pain description: burning Aggravating factors: knee flexion/ walking Relieving factors: ice/ medications  PRECAUTIONS: None  WEIGHT BEARING RESTRICTIONS: No  FALLS:  Has patient fallen in last 6 months? Yes. Number of falls 1 (no injury)  LIVING ENVIRONMENT: Lives with: lives with their spouse Lives in: House/apartment Has following equipment at home: Single point cane and Environmental consultant - 2 wheeled  OCCUPATION: retired  PLOF:  Independent  PATIENT GOALS: Increase L knee ROM/ strength and walking pain-free  NEXT MD VISIT: 02/01/23  OBJECTIVE:   PATIENT SURVEYS:  FOTO initial 27/ goal 31  COGNITION: Overall cognitive status: Within functional limits for tasks assessed     SENSATION: WFL  EDEMA:  Circumferential: L/R joint line (45.5/39 cm), 2" superior (47/40.5 cm), mid-gastroc (36.4/34.5 cm).  L knee bandages in place.    MUSCLE LENGTH: Hamstrings: Right 55 deg; Left 50 deg  POSTURE: rounded shoulders  PALPATION: Significant tenderness around L knee/ inner thigh (bruising).  No calf tenderness.  LOWER EXTREMITY ROM:  Active ROM Right eval Left eval  Hip flexion [facility]  Hip extension    Hip abduction    Hip adduction    Hip  internal rotation    Hip external rotation    Knee flexion 119 deg. 82 deg.  Knee extension -2 deg. -4 deg.  Ankle dorsiflexion    Ankle plantarflexion    Ankle inversion    Ankle eversion     (Blank rows = not tested)  L knee AA/PROM: 94 deg.  LOWER EXTREMITY MMT:  MMT Right eval Left eval  Hip flexion 4/5 4/5  Hip extension    Hip abduction 4/5 4/5  Hip adduction    Hip internal rotation    Hip external rotation    Knee flexion 5/5 4/5  Knee extension 5/5 3/5  Ankle dorsiflexion    Ankle plantarflexion    Ankle inversion    Ankle eversion     (Blank rows = not tested)  LOWER EXTREMITY SPECIAL TESTS:  N/A  FUNCTIONAL TESTS:  5 times sit to stand: TBD  GAIT: Distance walked: in clinic Assistive device utilized: Walker - 2 wheeled Level of assistance: SBA Comments: L antalgic gait with limited L hip/knee flexion during swing through phase of gait.   TODAY'S TREATMENT:                                                                                                                              DATE: 02/10/2023   Subjective: Pt. Arrived to PT with use of SPC.  Pt. Cancelled last PT appt. Due to hurting/ not feeling well.  Pt. Reports limited compliance with HEP and continues to not sleep well.    There.ex.:    Supine L knee ex.: heel slides/ quad sets with manual feedback/ glut sets/ marching 20x each.   Nustep L3 10 min. (Seat 9-7) with B UE/LE.    Standing marching/ hip abduction at //-bars with mirror feedback 20x each.  Seated LAQ 20x.  Standing heel raises 20x.  Partial squats with short hold 15x at //-bars with chair behind patient (cuing for proper technique)  Walking around PT clinic/ hallway/ outside with use of SPC and more consistent step pattern.  Pt. Able to sit in car backseat with L knee flexed today.    Reviewed HEP (encouraged pt. Be more compliant).  Manual tx.:  Supine L patellar mobs (all planes)- good incision healing/ steristrips in  place.  Supine L knee AA/PROM flexion and extension 10x each with holds.  L knee/ thigh/ calf STM in supine.  Supine L hip/knee manual isometrics with knee flexion stretch.  Supine L knee AROM in supine: -4 to 108 deg.   Pt. Encouraged to ice at home  PATIENT EDUCATION:  Education details: [email or web address] Person educated: Patient Education method: Explanation, Demonstration, and Handouts Education comprehension: verbalized understanding and returned demonstration  HOME EXERCISE PROGRAM: Access Code: [email or web address] URL: [email or web address] Date: 01/27/2023 Prepared by: Dorene Grebe Exercises - Supine Quad Set - 2 x daily - 7 x weekly - 1 sets - 10 reps - Supine Heel Slide with Strap - 2 x daily - 7 x weekly - 1 sets - 10 reps - Seated Long Arc Quad - 2 x daily - 7 x weekly - 1 sets - 10 reps - Seated Knee Flexion Extension AAROM with Overpressure - 2 x daily - 7 x weekly - 1 sets - 10 reps - Small Range Straight Leg Raise - 2 x daily - 7 x weekly - 1 sets - 10 reps   ASSESSMENT:  CLINICAL IMPRESSION: Pt. Progressing to use of SPC with more consistent gait pattern.  Good incision healing noted and steristrips in place.  Pt. Understands importance of L knee ROM/ strengthening and understands HEP.  Marked increase in L knee AROM (supine): 108 deg. Flexion.  Pt. Remains pain limited/ focused with L knee ROM and progressing functional mobility.   Pt. Will benefit from skilled PT services to increase L knee ROM/ strength to improve pain-free mobility.    OBJECTIVE IMPAIRMENTS: Abnormal gait, decreased activity tolerance, decreased balance, decreased endurance, decreased mobility, difficulty walking, decreased ROM, decreased strength, hypomobility, impaired flexibility, improper body mechanics, and pain.   ACTIVITY LIMITATIONS: carrying, lifting, bending, standing, squatting, stairs, transfers, locomotion level, and caring for others  PARTICIPATION LIMITATIONS: community activity, yard work,  and church  PERSONAL FACTORS: Age and Past/current experiences are also affecting patient's functional outcome.   REHAB POTENTIAL: Good  CLINICAL DECISION MAKING: Evolving/moderate complexity  EVALUATION COMPLEXITY: Moderate   GOALS: Goals reviewed with patient? Yes  SHORT TERM GOALS: Target date: 02/17/23 Pt. Independent with HEP to increase L knee AROM (0 to >115 deg.) to improve functional mobility.   Baseline:  see above Goal status: INITIAL  LONG TERM GOALS: Target date: 03/24/23  Pt. Will increase FOTO to 61 to improve pain-free mobility.  Baseline: initial 27 Goal status: INITIAL  2.  Pt. Able to ambulate community distance with more normalized gait pattern and on assistive device safely.   Baseline:  L antalgic gait with RW Goal status: INITIAL  3.  Pt. Able to manage wife's w/c into and out of trunk of car to be able to attend church/ events.   Baseline: unable to lift w/c at this time Goal status: INITIAL  PLAN:  PT FREQUENCY: 2x/week  PT DURATION: 8 weeks  PLANNED INTERVENTIONS: Therapeutic exercises, Therapeutic activity, Neuromuscular re-education, Balance training, Gait training, Patient/Family education, Self Care, Joint mobilization, Electrical stimulation, Cryotherapy, Moist heat, scar mobilization, Manual therapy, and Re-evaluation  PLAN FOR NEXT SESSION: Progress L knee ROM.  Issue standing LE ex.  Cammie Mcgee, PT, DPT # [phone] 02/10/2023, 12:08 PM

## 2023-02-15 ENCOUNTER — Encounter: Payer: Self-pay | Admitting: Physical Therapy

## 2023-02-15 ENCOUNTER — Ambulatory Visit: Payer: Medicare HMO | Admitting: Physical Therapy

## 2023-02-15 DIAGNOSIS — Z96652 Presence of left artificial knee joint: Secondary | ICD-10-CM | POA: Diagnosis not present

## 2023-02-15 DIAGNOSIS — M25662 Stiffness of left knee, not elsewhere classified: Secondary | ICD-10-CM

## 2023-02-15 DIAGNOSIS — R269 Unspecified abnormalities of gait and mobility: Secondary | ICD-10-CM

## 2023-02-15 DIAGNOSIS — M6281 Muscle weakness (generalized): Secondary | ICD-10-CM

## 2023-02-15 NOTE — Therapy (Signed)
OUTPATIENT PHYSICAL THERAPY LOWER EXTREMITY TREATMENT  Patient Name: Melvin Johnson MRN: [id number] DOB:1941/10/08, 82 y.o., male Today's Date: 02/15/2023  END OF SESSION:  PT End of Session - 02/15/23 1034     Visit Number 5    Number of Visits 16    Date for PT Re-Evaluation 03/24/23    PT Start Time 1034    PT Stop Time 1132    PT Time Calculation (min) 58 min             Past Medical History:  Diagnosis Date   Arthritis    At risk for sleep apnea    STOP-BANG=4        SENT TO PCP 06-26-2014   Coronary artery disease    no cardiologist for several years , sees pcp  dr mark Hyacinth Meeker   History of MI (myocardial infarction)    may 1999   Hyperlipidemia    Hypertension    Mild acid reflux    Neuropathy of both feet    burning sensation   Nocturia    Prostate cancer 03/2014   Gleason 3+3=6, seed implant 07/03/14   S/P CABG x 4    Seasonal allergies    Type 2 diabetes mellitus    Wears dentures    UPPER   Wears glasses    Past Surgical History:  Procedure Laterality Date   CARDIAC CATHETERIZATION  1999  ([place])   CATARACT EXTRACTION W/PHACO Right 03/02/2021   Procedure: CATARACT EXTRACTION PHACO AND INTRAOCULAR LENS PLACEMENT (IOC) RIGHT DIABETIC 5.79 00:45.2;  Surgeon: Nevada Crane, MD;  Location: [facility];  Service: Ophthalmology;  Laterality: Right;   CATARACT EXTRACTION W/PHACO Left 03/16/2021   Procedure: CATARACT EXTRACTION PHACO AND INTRAOCULAR LENS PLACEMENT (IOC) LEFT DIABETIC 5.90 00:41.5;  Surgeon: Nevada Crane, MD;  Location: [facility];  Service: Ophthalmology;  Laterality: Left;   CORONARY ARTERY BYPASS GRAFT  2003   dr vantright   4 VESSEL   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   RADIOACTIVE SEED IMPLANT N/A 07/03/2014   Procedure: RADIOACTIVE SEED IMPLANT;  Surgeon: Valetta Fuller, MD;  Location: [facility];  Service: Urology;  Laterality: N/A;   TONSILLECTOMY  as child   Patient Active Problem  List   Diagnosis Date Noted   Malignant neoplasm of prostate 05/02/2014    PCP: Danella Penton, MD  REFERRING PROVIDER: Lyndle Herrlich, MD  REFERRING DIAG: L total knee replacement  THERAPY DIAG:  History of total knee replacement, left  Joint stiffness of knee, left  Muscle weakness  Gait difficulty  Rationale for Evaluation and Treatment: Rehabilitation  ONSET DATE: 01/20/23 (surgery date).    SUBJECTIVE:   SUBJECTIVE STATEMENT:   EVALUATION Pt. S/p L TKA on 01/20/23.  Pt. Known well to PT clinic.  Pt. States he is in a lot of pain (8/10) and is having more pain in s/p L knee surgery than he did during R TKA.    PERTINENT HISTORY: See MD notes. PAIN:  Are you having pain? Yes: NPRS scale: 8/10 Pain location: L knee Pain description: burning Aggravating factors: knee flexion/ walking Relieving factors: ice/ medications  PRECAUTIONS: None  WEIGHT BEARING RESTRICTIONS: No  FALLS:  Has patient fallen in last 6 months? Yes. Number of falls 1 (no injury)  LIVING ENVIRONMENT: Lives with: lives with their spouse Lives in: House/apartment Has following equipment at home: Single point cane and Environmental consultant - 2 wheeled  OCCUPATION: retired  PLOF:  Independent  PATIENT GOALS: Increase L knee ROM/ strength and walking pain-free  NEXT MD VISIT: 02/01/23  OBJECTIVE:   PATIENT SURVEYS:  FOTO initial 27/ goal 14  COGNITION: Overall cognitive status: Within functional limits for tasks assessed     SENSATION: WFL  EDEMA:  Circumferential: L/R joint line (45.5/39 cm), 2" superior (47/40.5 cm), mid-gastroc (36.4/34.5 cm).  L knee bandages in place.    MUSCLE LENGTH: Hamstrings: Right 55 deg; Left 50 deg  POSTURE: rounded shoulders  PALPATION: Significant tenderness around L knee/ inner thigh (bruising).  No calf tenderness.  LOWER EXTREMITY ROM:  Active ROM Right eval Left eval  Hip flexion [facility]  Hip extension    Hip abduction    Hip adduction    Hip  internal rotation    Hip external rotation    Knee flexion 119 deg. 82 deg.  Knee extension -2 deg. -4 deg.  Ankle dorsiflexion    Ankle plantarflexion    Ankle inversion    Ankle eversion     (Blank rows = not tested)  L knee AA/PROM: 94 deg.  LOWER EXTREMITY MMT:  MMT Right eval Left eval  Hip flexion 4/5 4/5  Hip extension    Hip abduction 4/5 4/5  Hip adduction    Hip internal rotation    Hip external rotation    Knee flexion 5/5 4/5  Knee extension 5/5 3/5  Ankle dorsiflexion    Ankle plantarflexion    Ankle inversion    Ankle eversion     (Blank rows = not tested)  LOWER EXTREMITY SPECIAL TESTS:  N/A  FUNCTIONAL TESTS:  5 times sit to stand: TBD  GAIT: Distance walked: in clinic Assistive device utilized: Walker - 2 wheeled Level of assistance: SBA Comments: L antalgic gait with limited L hip/knee flexion during swing through phase of gait.   TODAY'S TREATMENT:                                                                                                                              DATE: 02/15/2023   Subjective: Pt. Arrived to PT with use of SPC and moderate L antalgic gait (hip ER).  Pt. States last Friday he was sitting in recliner and reached forward to put ice on L knee.  Pt. Reports a sharp pain in L knee/ posterior, medial aspect of L LE.  Pt. Reports 10/10 L knee pain prior to PT tx.    Manual tx.:   Supine L patellar mobs (all planes)- 4 steristrips remain.  PT reassessed pts. C/o knee pain.  Minimal warmth noted and pt. Reports tenderness over distal hamstring.  Excellent incision healing.    Supine L knee AA/PROM flexion and extension 10x each with holds.  L knee/ thigh/ calf STM in supine.  Supine L hip/knee manual isometrics with knee flexion stretch.  No calf tenderness noted.    Supine L knee AROM in supine: -4 to 108 deg. (Pain  focused today).  There.ex.:    Supine L knee ex.: heel slides/ quad sets with manual feedback/ glut sets/  marching 20x each.   Nustep L3 10 min. (Seat 9-7) with B UE/LE.    Seated LAQ 20x/ static hamstring stretches with PT assist 5x.    Walking around PT clinic/ hallway/ outside with use of SPC and more consistent step pattern.  Pt. Able to sit in car backseat with L knee flexed today.    Ice to L knee in supine position after tx.  Pt. Has difficulty maintaining L knee extension during icing and prefers L hip ER.    PATIENT EDUCATION:  Education details: [email or web address] Person educated: Patient Education method: Explanation, Demonstration, and Handouts Education comprehension: verbalized understanding and returned demonstration  HOME EXERCISE PROGRAM: Access Code: [email or web address] URL: [email or web address] Date: 01/27/2023 Prepared by: Dorene Grebe Exercises - Supine Quad Set - 2 x daily - 7 x weekly - 1 sets - 10 reps - Supine Heel Slide with Strap - 2 x daily - 7 x weekly - 1 sets - 10 reps - Seated Long Arc Quad - 2 x daily - 7 x weekly - 1 sets - 10 reps - Seated Knee Flexion Extension AAROM with Overpressure - 2 x daily - 7 x weekly - 1 sets - 10 reps - Small Range Straight Leg Raise - 2 x daily - 7 x weekly - 1 sets - 10 reps   ASSESSMENT:  CLINICAL IMPRESSION: Excellent incision healing noted and a few steristrips in place.  Pt. Understands importance of L knee ROM/ strengthening and understands HEP.  Pt. Remains pain limited/ focused with L knee ROM and progressing functional mobility.  PT instructed pt. To be more compliant with HEP to prevent L knee stiffness and promote increase ROM/ strengthening.   Pt. Will benefit from skilled PT services to increase L knee ROM/ strength to improve pain-free mobility.    OBJECTIVE IMPAIRMENTS: Abnormal gait, decreased activity tolerance, decreased balance, decreased endurance, decreased mobility, difficulty walking, decreased ROM, decreased strength, hypomobility, impaired flexibility, improper body mechanics, and pain.   ACTIVITY LIMITATIONS:  carrying, lifting, bending, standing, squatting, stairs, transfers, locomotion level, and caring for others  PARTICIPATION LIMITATIONS: community activity, yard work, and church  PERSONAL FACTORS: Age and Past/current experiences are also affecting patient's functional outcome.   REHAB POTENTIAL: Good  CLINICAL DECISION MAKING: Evolving/moderate complexity  EVALUATION COMPLEXITY: Moderate   GOALS: Goals reviewed with patient? Yes  SHORT TERM GOALS: Target date: 02/17/23 Pt. Independent with HEP to increase L knee AROM (0 to >115 deg.) to improve functional mobility.   Baseline:  see above Goal status: INITIAL  LONG TERM GOALS: Target date: 03/24/23  Pt. Will increase FOTO to 61 to improve pain-free mobility.  Baseline: initial 27 Goal status: INITIAL  2.  Pt. Able to ambulate community distance with more normalized gait pattern and on assistive device safely.   Baseline:  L antalgic gait with RW Goal status: INITIAL  3.  Pt. Able to manage wife's w/c into and out of trunk of car to be able to attend church/ events.   Baseline: unable to lift w/c at this time Goal status: INITIAL  PLAN:  PT FREQUENCY: 2x/week  PT DURATION: 8 weeks  PLANNED INTERVENTIONS: Therapeutic exercises, Therapeutic activity, Neuromuscular re-education, Balance training, Gait training, Patient/Family education, Self Care, Joint mobilization, Electrical stimulation, Cryotherapy, Moist heat, scar mobilization, Manual therapy, and Re-evaluation  PLAN FOR NEXT SESSION: Progress L knee ROM.    Kayleen Memos  Christel Mormon, PT, DPT # [id number] 02/15/2023, 8:07 PM

## 2023-02-17 ENCOUNTER — Ambulatory Visit: Payer: Medicare HMO | Admitting: Physical Therapy

## 2023-02-17 DIAGNOSIS — M6281 Muscle weakness (generalized): Secondary | ICD-10-CM

## 2023-02-17 DIAGNOSIS — M25662 Stiffness of left knee, not elsewhere classified: Secondary | ICD-10-CM

## 2023-02-17 DIAGNOSIS — R269 Unspecified abnormalities of gait and mobility: Secondary | ICD-10-CM

## 2023-02-17 DIAGNOSIS — Z96652 Presence of left artificial knee joint: Secondary | ICD-10-CM

## 2023-02-22 ENCOUNTER — Encounter: Payer: Self-pay | Admitting: Physical Therapy

## 2023-02-22 ENCOUNTER — Ambulatory Visit: Payer: Medicare HMO | Admitting: Physical Therapy

## 2023-02-22 DIAGNOSIS — M25662 Stiffness of left knee, not elsewhere classified: Secondary | ICD-10-CM

## 2023-02-22 DIAGNOSIS — M6281 Muscle weakness (generalized): Secondary | ICD-10-CM

## 2023-02-22 DIAGNOSIS — R269 Unspecified abnormalities of gait and mobility: Secondary | ICD-10-CM

## 2023-02-22 DIAGNOSIS — Z96652 Presence of left artificial knee joint: Secondary | ICD-10-CM

## 2023-02-22 NOTE — Therapy (Signed)
OUTPATIENT PHYSICAL THERAPY LOWER EXTREMITY TREATMENT  Patient Name: Melvin Johnson MRN: [id number] DOB:Oct 22, 1941, 82 y.o., male Today's Date: 02/17/23  END OF SESSION:  PT End of Session - 02/22/23 1203     Visit Number 6    Number of Visits 16    Date for PT Re-Evaluation 03/24/23    PT Start Time 1023    PT Stop Time 1118    PT Time Calculation (min) 55 min             Past Medical History:  Diagnosis Date   Arthritis    At risk for sleep apnea    STOP-BANG=4        SENT TO PCP 06-26-2014   Coronary artery disease    no cardiologist for several years , sees pcp  dr mark Hyacinth Meeker   History of MI (myocardial infarction)    may 1999   Hyperlipidemia    Hypertension    Mild acid reflux    Neuropathy of both feet    burning sensation   Nocturia    Prostate cancer (HCC) 03/2014   Gleason 3+3=6, seed implant 07/03/14   S/P CABG x 4    Seasonal allergies    Type 2 diabetes mellitus (HCC)    Wears dentures    UPPER   Wears glasses    Past Surgical History:  Procedure Laterality Date   CARDIAC CATHETERIZATION  1999  ([place])   CATARACT EXTRACTION W/PHACO Right 03/02/2021   Procedure: CATARACT EXTRACTION PHACO AND INTRAOCULAR LENS PLACEMENT (IOC) RIGHT DIABETIC 5.79 00:45.2;  Surgeon: Nevada Crane, MD;  Location: [facility];  Service: Ophthalmology;  Laterality: Right;   CATARACT EXTRACTION W/PHACO Left 03/16/2021   Procedure: CATARACT EXTRACTION PHACO AND INTRAOCULAR LENS PLACEMENT (IOC) LEFT DIABETIC 5.90 00:41.5;  Surgeon: Nevada Crane, MD;  Location: [facility];  Service: Ophthalmology;  Laterality: Left;   CORONARY ARTERY BYPASS GRAFT  2003   dr vantright   4 VESSEL   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   RADIOACTIVE SEED IMPLANT N/A 07/03/2014   Procedure: RADIOACTIVE SEED IMPLANT;  Surgeon: Valetta Fuller, MD;  Location: [facility];  Service: Urology;  Laterality: N/A;   TONSILLECTOMY  as child   Patient  Active Problem List   Diagnosis Date Noted   Malignant neoplasm of prostate (HCC) 05/02/2014    PCP: Danella Penton, MD  REFERRING PROVIDER: Lyndle Herrlich, MD  REFERRING DIAG: L total knee replacement  THERAPY DIAG:  History of total knee replacement, left  Joint stiffness of knee, left  Muscle weakness  Gait difficulty  Rationale for Evaluation and Treatment: Rehabilitation  ONSET DATE: 01/20/23 (surgery date).    SUBJECTIVE:   SUBJECTIVE STATEMENT:   EVALUATION Pt. S/p L TKA on 01/20/23.  Pt. Known well to PT clinic.  Pt. States he is in a lot of pain (8/10) and is having more pain in s/p L knee surgery than he did during R TKA.    PERTINENT HISTORY: See MD notes. PAIN:  Are you having pain? Yes: NPRS scale: 8/10 Pain location: L knee Pain description: burning Aggravating factors: knee flexion/ walking Relieving factors: ice/ medications  PRECAUTIONS: None  WEIGHT BEARING RESTRICTIONS: No  FALLS:  Has patient fallen in last 6 months? Yes. Number of falls 1 (no injury)  LIVING ENVIRONMENT: Lives with: lives with their spouse Lives in: House/apartment Has following equipment at home: Single point cane and Environmental consultant - 2 wheeled  OCCUPATION:  retired  PLOF: Independent  PATIENT GOALS: Increase L knee ROM/ strength and walking pain-free  NEXT MD VISIT: 02/01/23  OBJECTIVE:   PATIENT SURVEYS:  FOTO initial 27/ goal 70  COGNITION: Overall cognitive status: Within functional limits for tasks assessed     SENSATION: WFL  EDEMA:  Circumferential: L/R joint line (45.5/39 cm), 2" superior (47/40.5 cm), mid-gastroc (36.4/34.5 cm).  L knee bandages in place.    MUSCLE LENGTH: Hamstrings: Right 55 deg; Left 50 deg  POSTURE: rounded shoulders  PALPATION: Significant tenderness around L knee/ inner thigh (bruising).  No calf tenderness.  LOWER EXTREMITY ROM:  Active ROM Right eval Left eval  Hip flexion [facility]  Hip extension    Hip abduction    Hip  adduction    Hip internal rotation    Hip external rotation    Knee flexion 119 deg. 82 deg.  Knee extension -2 deg. -4 deg.  Ankle dorsiflexion    Ankle plantarflexion    Ankle inversion    Ankle eversion     (Blank rows = not tested)  L knee AA/PROM: 94 deg.  LOWER EXTREMITY MMT:  MMT Right eval Left eval  Hip flexion 4/5 4/5  Hip extension    Hip abduction 4/5 4/5  Hip adduction    Hip internal rotation    Hip external rotation    Knee flexion 5/5 4/5  Knee extension 5/5 3/5  Ankle dorsiflexion    Ankle plantarflexion    Ankle inversion    Ankle eversion     (Blank rows = not tested)  LOWER EXTREMITY SPECIAL TESTS:  N/A  FUNCTIONAL TESTS:  5 times sit to stand: TBD  GAIT: Distance walked: in clinic Assistive device utilized: Walker - 2 wheeled Level of assistance: SBA Comments: L antalgic gait with limited L hip/knee flexion during swing through phase of gait.   TODAY'S TREATMENT:                                                                                                                              DATE: 02/17/23   Subjective: Pt. Remains pain focused with L knee ROM/ walking with use of SPC.  Pt. States he is trying to bend knee at home/ complete HEP.      Manual tx.:   Supine L patellar mobs (all planes)- 2 steristrips remain.  Supine L knee AA/PROM flexion and extension 10x each with holds.  L knee/ thigh/ calf STM in supine.  Supine L hip/knee manual isometrics with knee flexion stretch.     Supine L knee AROM in supine: -4 to 108 deg. (Pain limited)  There.ex.:    Supine L knee ex.: heel slides/ quad sets with manual feedback/ SAQ/ glut sets/ marching 20x each.   Nustep L3 10 min. (Seat 9-7) with B UE/LE.    Seated LAQ 20x/ static hamstring stretches with PT assist 5x.    Walking around PT clinic/ hallway/ outside with  use of SPC and more consistent step pattern.  Pt. Able to sit in car backseat with L knee flexed today.    Pt. Will ice  L knee at home.     PATIENT EDUCATION:  Education details: [email or web address] Person educated: Patient Education method: Explanation, Demonstration, and Handouts Education comprehension: verbalized understanding and returned demonstration  HOME EXERCISE PROGRAM: Access Code: [email or web address] URL: [email or web address] Date: 01/27/2023 Prepared by: Dorene Grebe Exercises - Supine Quad Set - 2 x daily - 7 x weekly - 1 sets - 10 reps - Supine Heel Slide with Strap - 2 x daily - 7 x weekly - 1 sets - 10 reps - Seated Long Arc Quad - 2 x daily - 7 x weekly - 1 sets - 10 reps - Seated Knee Flexion Extension AAROM with Overpressure - 2 x daily - 7 x weekly - 1 sets - 10 reps - Small Range Straight Leg Raise - 2 x daily - 7 x weekly - 1 sets - 10 reps   ASSESSMENT:  CLINICAL IMPRESSION: Pt. Remains pain limited/ focused with L knee ROM and progressing functional mobility.  Pt. Has 108 deg. L knee AROM in supine position after manual stretches/ patellar mobs.  PT instructed pt. To be more compliant with HEP to prevent L knee stiffness and promote increase ROM/ strengthening.   Pt. Will benefit from skilled PT services to increase L knee ROM/ strength to improve pain-free mobility.    OBJECTIVE IMPAIRMENTS: Abnormal gait, decreased activity tolerance, decreased balance, decreased endurance, decreased mobility, difficulty walking, decreased ROM, decreased strength, hypomobility, impaired flexibility, improper body mechanics, and pain.   ACTIVITY LIMITATIONS: carrying, lifting, bending, standing, squatting, stairs, transfers, locomotion level, and caring for others  PARTICIPATION LIMITATIONS: community activity, yard work, and church  PERSONAL FACTORS: Age and Past/current experiences are also affecting patient's functional outcome.   REHAB POTENTIAL: Good  CLINICAL DECISION MAKING: Evolving/moderate complexity  EVALUATION COMPLEXITY: Moderate   GOALS: Goals reviewed with patient? Yes  SHORT TERM  GOALS: Target date: 02/17/23 Pt. Independent with HEP to increase L knee AROM (0 to >115 deg.) to improve functional mobility.   Baseline:  see above Goal status: Partially met  LONG TERM GOALS: Target date: 03/24/23  Pt. Will increase FOTO to 61 to improve pain-free mobility.  Baseline: initial 27 Goal status: INITIAL  2.  Pt. Able to ambulate community distance with more normalized gait pattern and on assistive device safely.   Baseline:  L antalgic gait with RW Goal status: INITIAL  3.  Pt. Able to manage wife's w/c into and out of trunk of car to be able to attend church/ events.   Baseline: unable to lift w/c at this time Goal status: INITIAL  PLAN:  PT FREQUENCY: 2x/week  PT DURATION: 8 weeks  PLANNED INTERVENTIONS: Therapeutic exercises, Therapeutic activity, Neuromuscular re-education, Balance training, Gait training, Patient/Family education, Self Care, Joint mobilization, Electrical stimulation, Cryotherapy, Moist heat, scar mobilization, Manual therapy, and Re-evaluation  PLAN FOR NEXT SESSION: Progress L knee ROM.    Cammie Mcgee, PT, DPT # [phone] 02/22/2023, 12:04 PM

## 2023-02-22 NOTE — Therapy (Signed)
OUTPATIENT PHYSICAL THERAPY LOWER EXTREMITY TREATMENT  Patient Name: Melvin Johnson MRN: [id number] DOB:[birth year], 82 y.o., male Today's Date: 02/22/2023  END OF SESSION:  PT End of Session - 02/22/23 1245     Visit Number 7    Number of Visits 16    Date for PT Re-Evaluation 03/24/23    PT Start Time 1029    PT Stop Time 1120    PT Time Calculation (min) 51 min             Past Medical History:  Diagnosis Date   Arthritis    At risk for sleep apnea    STOP-BANG=4        SENT TO PCP 06-26-2014   Coronary artery disease    no cardiologist for several years , sees pcp  dr mark Hyacinth Meeker   History of MI (myocardial infarction)    may 1999   Hyperlipidemia    Hypertension    Mild acid reflux    Neuropathy of both feet    burning sensation   Nocturia    Prostate cancer (HCC) 03/2014   Gleason 3+3=6, seed implant 07/03/14   S/P CABG x 4    Seasonal allergies    Type 2 diabetes mellitus (HCC)    Wears dentures    UPPER   Wears glasses    Past Surgical History:  Procedure Laterality Date   CARDIAC CATHETERIZATION  1999  ([place])   CATARACT EXTRACTION W/PHACO Right 03/02/2021   Procedure: CATARACT EXTRACTION PHACO AND INTRAOCULAR LENS PLACEMENT (IOC) RIGHT DIABETIC 5.79 00:45.2;  Surgeon: Nevada Crane, MD;  Location: [facility];  Service: Ophthalmology;  Laterality: Right;   CATARACT EXTRACTION W/PHACO Left 03/16/2021   Procedure: CATARACT EXTRACTION PHACO AND INTRAOCULAR LENS PLACEMENT (IOC) LEFT DIABETIC 5.90 00:41.5;  Surgeon: Nevada Crane, MD;  Location: [facility];  Service: Ophthalmology;  Laterality: Left;   CORONARY ARTERY BYPASS GRAFT  2003   dr vantright   4 VESSEL   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   RADIOACTIVE SEED IMPLANT N/A 07/03/2014   Procedure: RADIOACTIVE SEED IMPLANT;  Surgeon: Valetta Fuller, MD;  Location: [facility];  Service: Urology;  Laterality: N/A;   TONSILLECTOMY  as child   Patient  Active Problem List   Diagnosis Date Noted   Malignant neoplasm of prostate (HCC) 05/02/2014    PCP: Danella Penton, MD  REFERRING PROVIDER: Lyndle Herrlich, MD  REFERRING DIAG: L total knee replacement  THERAPY DIAG:  History of total knee replacement, left  Joint stiffness of knee, left  Muscle weakness  Gait difficulty  Rationale for Evaluation and Treatment: Rehabilitation  ONSET DATE: 01/20/23 (surgery date).    SUBJECTIVE:   SUBJECTIVE STATEMENT:   EVALUATION Pt. S/p L TKA on 01/20/23.  Pt. Known well to PT clinic.  Pt. States he is in a lot of pain (8/10) and is having more pain in s/p L knee surgery than he did during R TKA.    PERTINENT HISTORY: See MD notes. PAIN:  Are you having pain? Yes: NPRS scale: 8/10 Pain location: L knee Pain description: burning Aggravating factors: knee flexion/ walking Relieving factors: ice/ medications  PRECAUTIONS: None  WEIGHT BEARING RESTRICTIONS: No  FALLS:  Has patient fallen in last 6 months? Yes. Number of falls 1 (no injury)  LIVING ENVIRONMENT: Lives with: lives with their spouse Lives in: House/apartment Has following equipment at home: Single point cane and Environmental consultant - 2 wheeled  OCCUPATION:  retired  PLOF: Independent  PATIENT GOALS: Increase L knee ROM/ strength and walking pain-free  NEXT MD VISIT: 02/01/23  OBJECTIVE:   PATIENT SURVEYS:  FOTO initial 27/ goal 60  COGNITION: Overall cognitive status: Within functional limits for tasks assessed     SENSATION: WFL  EDEMA:  Circumferential: L/R joint line (45.5/39 cm), 2" superior (47/40.5 cm), mid-gastroc (36.4/34.5 cm).  L knee bandages in place.    MUSCLE LENGTH: Hamstrings: Right 55 deg; Left 50 deg  POSTURE: rounded shoulders  PALPATION: Significant tenderness around L knee/ inner thigh (bruising).  No calf tenderness.  LOWER EXTREMITY ROM:  Active ROM Right eval Left eval  Hip flexion [facility]  Hip extension    Hip abduction    Hip  adduction    Hip internal rotation    Hip external rotation    Knee flexion 119 deg. 82 deg.  Knee extension -2 deg. -4 deg.  Ankle dorsiflexion    Ankle plantarflexion    Ankle inversion    Ankle eversion     (Blank rows = not tested)  L knee AA/PROM: 94 deg.  LOWER EXTREMITY MMT:  MMT Right eval Left eval  Hip flexion 4/5 4/5  Hip extension    Hip abduction 4/5 4/5  Hip adduction    Hip internal rotation    Hip external rotation    Knee flexion 5/5 4/5  Knee extension 5/5 3/5  Ankle dorsiflexion    Ankle plantarflexion    Ankle inversion    Ankle eversion     (Blank rows = not tested)  LOWER EXTREMITY SPECIAL TESTS:  N/A  FUNCTIONAL TESTS:  5 times sit to stand: TBD  GAIT: Distance walked: in clinic Assistive device utilized: Walker - 2 wheeled Level of assistance: SBA Comments: L antalgic gait with limited L hip/knee flexion during swing through phase of gait.   TODAY'S TREATMENT:                                                                                                                              DATE: 02/22/2023   Subjective:  Pt. Drove to PT today for 1st time since TKA.  Pt. Able to put wife's w/c in trunk without assistance.  Pt. Remains pain focused with L knee ROM/ walking without use of SPC today.  Pt. States he is trying to bend knee at home/ complete HEP.      Manual tx.:   Supine L patellar mobs (all planes)- no steristrips with small area of redness/ gapping.  PT applied bandage to improve incision healing.    Supine L knee AA/PROM flexion and extension 6x each with holds.  L knee/ thigh/ calf STM in supine.       Supine L knee AROM in supine: -4 to 108 deg. (Pain limited).  L knee AA/PROM to 110 deg. (Pain limited).    There.ex.:    Supine L knee ex.: heel slides/ SLR 10x each.  Discussed/ reviewed HEP  Nustep L4 10 min. (Seat 8-7) with B UE/LE.    Seated LAQ 20x/ static hamstring stretches with PT assist 5x.    Walking around PT  clinic/ hallway/ outside with no assistive device and more consistent step pattern.  Pt. Able to sit in drivers seated of car to drive.    Standing marching/ heel raises/ hamstring curls 20x at //-bars with mirror feedback.     Pt. Will ice L knee at home.     PATIENT EDUCATION:  Education details: [email or web address] Person educated: Patient Education method: Explanation, Demonstration, and Handouts Education comprehension: verbalized understanding and returned demonstration  HOME EXERCISE PROGRAM: Access Code: [email or web address] URL: [email or web address] Date: 01/27/2023 Prepared by: Dorene Grebe Exercises - Supine Quad Set - 2 x daily - 7 x weekly - 1 sets - 10 reps - Supine Heel Slide with Strap - 2 x daily - 7 x weekly - 1 sets - 10 reps - Seated Long Arc Quad - 2 x daily - 7 x weekly - 1 sets - 10 reps - Seated Knee Flexion Extension AAROM with Overpressure - 2 x daily - 7 x weekly - 1 sets - 10 reps - Small Range Straight Leg Raise - 2 x daily - 7 x weekly - 1 sets - 10 reps   ASSESSMENT:  CLINICAL IMPRESSION: Pt. Remains pain limited/ focused with L knee ROM and progressing functional mobility.  Pt. Has 108 deg. L knee AROM in supine position after manual stretches/ patellar mobs.  Pt. C/o less overall pain in L knee as compared to last week and ambulates without SPC.  PT cued pt. To increase L hip/knee flexion during swing through phase of gait to prevent L knee joint stiffness.   Pt. Will benefit from skilled PT services to increase L knee ROM/ strength to improve pain-free mobility.    OBJECTIVE IMPAIRMENTS: Abnormal gait, decreased activity tolerance, decreased balance, decreased endurance, decreased mobility, difficulty walking, decreased ROM, decreased strength, hypomobility, impaired flexibility, improper body mechanics, and pain.   ACTIVITY LIMITATIONS: carrying, lifting, bending, standing, squatting, stairs, transfers, locomotion level, and caring for others  PARTICIPATION  LIMITATIONS: community activity, yard work, and church  PERSONAL FACTORS: Age and Past/current experiences are also affecting patient's functional outcome.   REHAB POTENTIAL: Good  CLINICAL DECISION MAKING: Evolving/moderate complexity  EVALUATION COMPLEXITY: Moderate   GOALS: Goals reviewed with patient? Yes  SHORT TERM GOALS: Target date: 02/17/23 Pt. Independent with HEP to increase L knee AROM (0 to >115 deg.) to improve functional mobility.   Baseline:  see above Goal status: Partially met  LONG TERM GOALS: Target date: 03/24/23  Pt. Will increase FOTO to 61 to improve pain-free mobility.  Baseline: initial 27 Goal status: INITIAL  2.  Pt. Able to ambulate community distance with more normalized gait pattern and on assistive device safely.   Baseline:  L antalgic gait with RW Goal status: INITIAL  3.  Pt. Able to manage wife's w/c into and out of trunk of car to be able to attend church/ events.   Baseline: unable to lift w/c at this time Goal status: INITIAL  PLAN:  PT FREQUENCY: 2x/week  PT DURATION: 8 weeks  PLANNED INTERVENTIONS: Therapeutic exercises, Therapeutic activity, Neuromuscular re-education, Balance training, Gait training, Patient/Family education, Self Care, Joint mobilization, Electrical stimulation, Cryotherapy, Moist heat, scar mobilization, Manual therapy, and Re-evaluation  PLAN FOR NEXT SESSION: Progress L knee ROM.    Cammie Mcgee, PT, DPT # [phone] 02/22/2023, 12:46 PM

## 2023-02-24 ENCOUNTER — Encounter: Payer: Self-pay | Admitting: Physical Therapy

## 2023-02-24 ENCOUNTER — Ambulatory Visit: Payer: Medicare HMO | Attending: Orthopedic Surgery | Admitting: Physical Therapy

## 2023-02-24 DIAGNOSIS — Z96652 Presence of left artificial knee joint: Secondary | ICD-10-CM | POA: Diagnosis present

## 2023-02-24 DIAGNOSIS — M6281 Muscle weakness (generalized): Secondary | ICD-10-CM | POA: Diagnosis present

## 2023-02-24 DIAGNOSIS — R269 Unspecified abnormalities of gait and mobility: Secondary | ICD-10-CM | POA: Diagnosis present

## 2023-02-24 DIAGNOSIS — M25662 Stiffness of left knee, not elsewhere classified: Secondary | ICD-10-CM | POA: Insufficient documentation

## 2023-02-24 NOTE — Therapy (Signed)
OUTPATIENT PHYSICAL THERAPY LOWER EXTREMITY TREATMENT  Patient Name: Melvin Johnson MRN: [id number] DOB:[birth year], 82 y.o., male Today's Date: 02/24/2023  END OF SESSION:  PT End of Session - 02/24/23 1025     Visit Number 8    Number of Visits 16    Date for PT Re-Evaluation 03/24/23    PT Start Time 1025             Past Medical History:  Diagnosis Date   Arthritis    At risk for sleep apnea    STOP-BANG=4        SENT TO PCP 06-26-2014   Coronary artery disease    no cardiologist for several years , sees pcp  dr mark Hyacinth Meeker   History of MI (myocardial infarction)    may 1999   Hyperlipidemia    Hypertension    Mild acid reflux    Neuropathy of both feet    burning sensation   Nocturia    Prostate cancer (HCC) 03/2014   Gleason 3+3=6, seed implant 07/03/14   S/P CABG x 4    Seasonal allergies    Type 2 diabetes mellitus (HCC)    Wears dentures    UPPER   Wears glasses    Past Surgical History:  Procedure Laterality Date   CARDIAC CATHETERIZATION  1999  ([place])   CATARACT EXTRACTION W/PHACO Right 03/02/2021   Procedure: CATARACT EXTRACTION PHACO AND INTRAOCULAR LENS PLACEMENT (IOC) RIGHT DIABETIC 5.79 00:45.2;  Surgeon: Nevada Crane, MD;  Location: [facility];  Service: Ophthalmology;  Laterality: Right;   CATARACT EXTRACTION W/PHACO Left 03/16/2021   Procedure: CATARACT EXTRACTION PHACO AND INTRAOCULAR LENS PLACEMENT (IOC) LEFT DIABETIC 5.90 00:41.5;  Surgeon: Nevada Crane, MD;  Location: [facility];  Service: Ophthalmology;  Laterality: Left;   CORONARY ARTERY BYPASS GRAFT  2003   dr vantright   4 VESSEL   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   RADIOACTIVE SEED IMPLANT N/A 07/03/2014   Procedure: RADIOACTIVE SEED IMPLANT;  Surgeon: Valetta Fuller, MD;  Location: [facility];  Service: Urology;  Laterality: N/A;   TONSILLECTOMY  as child   Patient Active Problem List   Diagnosis Date Noted   Malignant  neoplasm of prostate (HCC) 05/02/2014    PCP: Danella Penton, MD  REFERRING PROVIDER: Lyndle Herrlich, MD  REFERRING DIAG: L total knee replacement  THERAPY DIAG:  History of total knee replacement, left  Joint stiffness of knee, left  Muscle weakness  Gait difficulty  Rationale for Evaluation and Treatment: Rehabilitation  ONSET DATE: 01/20/23 (surgery date).    SUBJECTIVE:   SUBJECTIVE STATEMENT:   EVALUATION Pt. S/p L TKA on 01/20/23.  Pt. Known well to PT clinic.  Pt. States he is in a lot of pain (8/10) and is having more pain in s/p L knee surgery than he did during R TKA.    PERTINENT HISTORY: See MD notes. PAIN:  Are you having pain? Yes: NPRS scale: 8/10 Pain location: L knee Pain description: burning Aggravating factors: knee flexion/ walking Relieving factors: ice/ medications  PRECAUTIONS: None  WEIGHT BEARING RESTRICTIONS: No  FALLS:  Has patient fallen in last 6 months? Yes. Number of falls 1 (no injury)  LIVING ENVIRONMENT: Lives with: lives with their spouse Lives in: House/apartment Has following equipment at home: Single point cane and Environmental consultant - 2 wheeled  OCCUPATION: retired  PLOF: Independent  PATIENT GOALS: Increase L knee ROM/ strength and walking pain-free  NEXT MD VISIT: 02/01/23  OBJECTIVE:   PATIENT SURVEYS:  FOTO initial 27/ goal 89  COGNITION: Overall cognitive status: Within functional limits for tasks assessed     SENSATION: WFL  EDEMA:  Circumferential: L/R joint line (45.5/39 cm), 2" superior (47/40.5 cm), mid-gastroc (36.4/34.5 cm).  L knee bandages in place.    MUSCLE LENGTH: Hamstrings: Right 55 deg; Left 50 deg  POSTURE: rounded shoulders  PALPATION: Significant tenderness around L knee/ inner thigh (bruising).  No calf tenderness.  LOWER EXTREMITY ROM:  Active ROM Right eval Left eval  Hip flexion [facility]  Hip extension    Hip abduction    Hip adduction    Hip internal rotation    Hip external  rotation    Knee flexion 119 deg. 82 deg.  Knee extension -2 deg. -4 deg.  Ankle dorsiflexion    Ankle plantarflexion    Ankle inversion    Ankle eversion     (Blank rows = not tested)  L knee AA/PROM: 94 deg.  LOWER EXTREMITY MMT:  MMT Right eval Left eval  Hip flexion 4/5 4/5  Hip extension    Hip abduction 4/5 4/5  Hip adduction    Hip internal rotation    Hip external rotation    Knee flexion 5/5 4/5  Knee extension 5/5 3/5  Ankle dorsiflexion    Ankle plantarflexion    Ankle inversion    Ankle eversion     (Blank rows = not tested)  LOWER EXTREMITY SPECIAL TESTS:  N/A  FUNCTIONAL TESTS:  5 times sit to stand: TBD  GAIT: Distance walked: in clinic Assistive device utilized: Walker - 2 wheeled Level of assistance: SBA Comments: L antalgic gait with limited L hip/knee flexion during swing through phase of gait.   TODAY'S TREATMENT:                                                                                                                              DATE: 02/24/2023   Subjective:  Pt. Drove to PT today for 1st time since TKA.  Pt. Able to put wife's w/c in trunk without assistance.  Pt. Remains pain focused with L knee ROM/ walking without use of SPC today.  Pt. States he is trying to bend knee at home/ complete HEP.      Manual tx.:   Supine L patellar mobs (all planes)- no steristrips with small area of redness/ gapping.  PT applied bandage to improve incision healing.    Supine L knee AA/PROM flexion and extension 6x each with holds.  L knee/ thigh/ calf STM in supine.       Supine L knee AROM in supine: -4 to 108 deg. (Pain limited).  L knee AA/PROM to 110 deg. (Pain limited).    There.ex.:    Nustep L4 10 min. (Seat 8-7) with B UE/LE.   Supine L knee ex.: heel slides/ SLR 10x each.  Discussed/ reviewed HEP  Seated LAQ 20x/ static hamstring stretches with PT assist 5x.    Walking around PT clinic/ hallway/ outside with no assistive device and  more consistent step pattern.  Pt. Able to sit in drivers seated of car to drive.    Standing marching/ heel raises/ hamstring curls 20x at //-bars with mirror feedback.     Pt. Will ice L knee at home.     PATIENT EDUCATION:  Education details: [email or web address] Person educated: Patient Education method: Explanation, Demonstration, and Handouts Education comprehension: verbalized understanding and returned demonstration  HOME EXERCISE PROGRAM: Access Code: [email or web address] URL: [email or web address] Date: 01/27/2023 Prepared by: Dorene Grebe Exercises - Supine Quad Set - 2 x daily - 7 x weekly - 1 sets - 10 reps - Supine Heel Slide with Strap - 2 x daily - 7 x weekly - 1 sets - 10 reps - Seated Long Arc Quad - 2 x daily - 7 x weekly - 1 sets - 10 reps - Seated Knee Flexion Extension AAROM with Overpressure - 2 x daily - 7 x weekly - 1 sets - 10 reps - Small Range Straight Leg Raise - 2 x daily - 7 x weekly - 1 sets - 10 reps   ASSESSMENT:  CLINICAL IMPRESSION: Pt. Remains pain limited/ focused with L knee ROM and progressing functional mobility.  Pt. Has 108 deg. L knee AROM in supine position after manual stretches/ patellar mobs.  Pt. C/o less overall pain in L knee as compared to last week and ambulates without SPC.  PT cued pt. To increase L hip/knee flexion during swing through phase of gait to prevent L knee joint stiffness.   Pt. Will benefit from skilled PT services to increase L knee ROM/ strength to improve pain-free mobility.    OBJECTIVE IMPAIRMENTS: Abnormal gait, decreased activity tolerance, decreased balance, decreased endurance, decreased mobility, difficulty walking, decreased ROM, decreased strength, hypomobility, impaired flexibility, improper body mechanics, and pain.   ACTIVITY LIMITATIONS: carrying, lifting, bending, standing, squatting, stairs, transfers, locomotion level, and caring for others  PARTICIPATION LIMITATIONS: community activity, yard work, and  church  PERSONAL FACTORS: Age and Past/current experiences are also affecting patient's functional outcome.   REHAB POTENTIAL: Good  CLINICAL DECISION MAKING: Evolving/moderate complexity  EVALUATION COMPLEXITY: Moderate   GOALS: Goals reviewed with patient? Yes  SHORT TERM GOALS: Target date: 02/17/23 Pt. Independent with HEP to increase L knee AROM (0 to >115 deg.) to improve functional mobility.   Baseline:  see above Goal status: Partially met  LONG TERM GOALS: Target date: 03/24/23  Pt. Will increase FOTO to 61 to improve pain-free mobility.  Baseline: initial 27 Goal status: INITIAL  2.  Pt. Able to ambulate community distance with more normalized gait pattern and on assistive device safely.   Baseline:  L antalgic gait with RW Goal status: INITIAL  3.  Pt. Able to manage wife's w/c into and out of trunk of car to be able to attend church/ events.   Baseline: unable to lift w/c at this time Goal status: INITIAL  PLAN:  PT FREQUENCY: 2x/week  PT DURATION: 8 weeks  PLANNED INTERVENTIONS: Therapeutic exercises, Therapeutic activity, Neuromuscular re-education, Balance training, Gait training, Patient/Family education, Self Care, Joint mobilization, Electrical stimulation, Cryotherapy, Moist heat, scar mobilization, Manual therapy, and Re-evaluation  PLAN FOR NEXT SESSION: Progress L knee ROM.    Cammie Mcgee, PT, DPT # [phone] 02/24/2023, 10:26 AM

## 2023-03-01 ENCOUNTER — Ambulatory Visit: Payer: Medicare HMO | Admitting: Physical Therapy

## 2023-03-01 ENCOUNTER — Encounter: Payer: Self-pay | Admitting: Physical Therapy

## 2023-03-01 DIAGNOSIS — Z96652 Presence of left artificial knee joint: Secondary | ICD-10-CM

## 2023-03-01 DIAGNOSIS — M6281 Muscle weakness (generalized): Secondary | ICD-10-CM

## 2023-03-01 DIAGNOSIS — M25662 Stiffness of left knee, not elsewhere classified: Secondary | ICD-10-CM

## 2023-03-01 DIAGNOSIS — R269 Unspecified abnormalities of gait and mobility: Secondary | ICD-10-CM

## 2023-03-01 NOTE — Therapy (Signed)
OUTPATIENT PHYSICAL THERAPY LOWER EXTREMITY TREATMENT  Patient Name: Melvin Johnson MRN: [id number] DOB:[birth year], 82 y.o., male Today's Date: 03/01/2023  END OF SESSION:  PT End of Session - 03/01/23 1022     Visit Number 9    Number of Visits 16    Date for PT Re-Evaluation 03/24/23    PT Start Time 1022    PT Stop Time 1130    PT Time Calculation (min) 68 min             Past Medical History:  Diagnosis Date   Arthritis    At risk for sleep apnea    STOP-BANG=4        SENT TO PCP 06-26-2014   Coronary artery disease    no cardiologist for several years , sees pcp  dr mark Hyacinth Meeker   History of MI (myocardial infarction)    may 1999   Hyperlipidemia    Hypertension    Mild acid reflux    Neuropathy of both feet    burning sensation   Nocturia    Prostate cancer (HCC) 03/2014   Gleason 3+3=6, seed implant 07/03/14   S/P CABG x 4    Seasonal allergies    Type 2 diabetes mellitus (HCC)    Wears dentures    UPPER   Wears glasses    Past Surgical History:  Procedure Laterality Date   CARDIAC CATHETERIZATION  1999  ([place])   CATARACT EXTRACTION W/PHACO Right 03/02/2021   Procedure: CATARACT EXTRACTION PHACO AND INTRAOCULAR LENS PLACEMENT (IOC) RIGHT DIABETIC 5.79 00:45.2;  Surgeon: Nevada Crane, MD;  Location: [facility];  Service: Ophthalmology;  Laterality: Right;   CATARACT EXTRACTION W/PHACO Left 03/16/2021   Procedure: CATARACT EXTRACTION PHACO AND INTRAOCULAR LENS PLACEMENT (IOC) LEFT DIABETIC 5.90 00:41.5;  Surgeon: Nevada Crane, MD;  Location: [facility];  Service: Ophthalmology;  Laterality: Left;   CORONARY ARTERY BYPASS GRAFT  2003   dr vantright   4 VESSEL   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   RADIOACTIVE SEED IMPLANT N/A 07/03/2014   Procedure: RADIOACTIVE SEED IMPLANT;  Surgeon: Valetta Fuller, MD;  Location: [facility];  Service: Urology;  Laterality: N/A;   TONSILLECTOMY  as child   Patient  Active Problem List   Diagnosis Date Noted   Malignant neoplasm of prostate (HCC) 05/02/2014    PCP: Danella Penton, MD  REFERRING PROVIDER: Lyndle Herrlich, MD  REFERRING DIAG: L total knee replacement  THERAPY DIAG:  History of total knee replacement, left  Joint stiffness of knee, left  Muscle weakness  Gait difficulty  Rationale for Evaluation and Treatment: Rehabilitation  ONSET DATE: 01/20/23 (surgery date).    SUBJECTIVE:   SUBJECTIVE STATEMENT:   EVALUATION Pt. S/p L TKA on 01/20/23.  Pt. Known well to PT clinic.  Pt. States he is in a lot of pain (8/10) and is having more pain in s/p L knee surgery than he did during R TKA.    PERTINENT HISTORY: See MD notes. PAIN:  Are you having pain? Yes: NPRS scale: 8/10 Pain location: L knee Pain description: burning Aggravating factors: knee flexion/ walking Relieving factors: ice/ medications  PRECAUTIONS: None  WEIGHT BEARING RESTRICTIONS: No  FALLS:  Has patient fallen in last 6 months? Yes. Number of falls 1 (no injury)  LIVING ENVIRONMENT: Lives with: lives with their spouse Lives in: House/apartment Has following equipment at home: Single point cane and Environmental consultant - 2 wheeled  OCCUPATION:  retired  PLOF: Independent  PATIENT GOALS: Increase L knee ROM/ strength and walking pain-free  NEXT MD VISIT: 02/01/23  OBJECTIVE:   PATIENT SURVEYS:  FOTO initial 27/ goal 21  COGNITION: Overall cognitive status: Within functional limits for tasks assessed     SENSATION: WFL  EDEMA:  Circumferential: L/R joint line (45.5/39 cm), 2" superior (47/40.5 cm), mid-gastroc (36.4/34.5 cm).  L knee bandages in place.    MUSCLE LENGTH: Hamstrings: Right 55 deg; Left 50 deg  POSTURE: rounded shoulders  PALPATION: Significant tenderness around L knee/ inner thigh (bruising).  No calf tenderness.  LOWER EXTREMITY ROM:  Active ROM Right eval Left eval  Hip flexion [facility]  Hip extension    Hip abduction    Hip  adduction    Hip internal rotation    Hip external rotation    Knee flexion 119 deg. 82 deg.  Knee extension -2 deg. -4 deg.  Ankle dorsiflexion    Ankle plantarflexion    Ankle inversion    Ankle eversion     (Blank rows = not tested)  L knee AA/PROM: 94 deg.  LOWER EXTREMITY MMT:  MMT Right eval Left eval  Hip flexion 4/5 4/5  Hip extension    Hip abduction 4/5 4/5  Hip adduction    Hip internal rotation    Hip external rotation    Knee flexion 5/5 4/5  Knee extension 5/5 3/5  Ankle dorsiflexion    Ankle plantarflexion    Ankle inversion    Ankle eversion     (Blank rows = not tested)  LOWER EXTREMITY SPECIAL TESTS:  N/A  FUNCTIONAL TESTS:  5 times sit to stand: TBD  GAIT: Distance walked: in clinic Assistive device utilized: Walker - 2 wheeled Level of assistance: SBA Comments: L antalgic gait with limited L hip/knee flexion during swing through phase of gait.   TODAY'S TREATMENT:                                                                                                                              DATE: 03/01/2023   Subjective:  Pt. Entered PT with use of SPC and limited L hip/knee flexion.  Pt. Remains pain focused with L knee ROM/ walking without use of SPC today.  Pt. Reports taking pain meds but no benefit.  Pt. Has MD f/u tomorrow to reassess TKA.  Pt. Reports L knee pain is a 10/10 while walking into PT gym.    There.ex.:    Nustep L4 10 min. (Seat 8-7) with B UE/LE.   Standing marching/ heel raises/ hamstring curls/ hip abduction 20x at //-bars with mirror feedback.  Supine L knee ex.: heel slides/ SLR 10x each.    Seated LAQ 20x.   Walking around PT clinic/ hallway/ outside with no assistive device and more consistent step pattern.  Pt. Able to sit in drivers seated of car to drive.       Manual tx.:  Supine L patellar mobs (all planes).  Good incision healing with no adhesions noted.   Supine L knee AA/PROM flexion and extension 10x  each with holds.  L knee/ thigh/ calf STM in supine.       Supine L knee AROM in supine: -3 to 110 deg. (Pain limited) after manual stretches.    Pt. Will ice L knee at home.     PATIENT EDUCATION:  Education details: [email or web address] Person educated: Patient Education method: Explanation, Demonstration, and Handouts Education comprehension: verbalized understanding and returned demonstration  HOME EXERCISE PROGRAM: Access Code: [email or web address] URL: [email or web address] Date: 01/27/2023 Prepared by: Dorene Grebe Exercises - Supine Quad Set - 2 x daily - 7 x weekly - 1 sets - 10 reps - Supine Heel Slide with Strap - 2 x daily - 7 x weekly - 1 sets - 10 reps - Seated Long Arc Quad - 2 x daily - 7 x weekly - 1 sets - 10 reps - Seated Knee Flexion Extension AAROM with Overpressure - 2 x daily - 7 x weekly - 1 sets - 10 reps - Small Range Straight Leg Raise - 2 x daily - 7 x weekly - 1 sets - 10 reps   ASSESSMENT:  CLINICAL IMPRESSION: Pt. Remains pain limited/ focused with L knee ROM and progressing functional mobility.  Pt. Has 110 deg. L knee AROM in supine position after manual stretches/ patellar mobs.  Pt. C/o less overall pain in L knee as compared to last week and ambulates without SPC.  PT cued pt. To increase L hip/knee flexion during swing through phase of gait to prevent L knee joint stiffness.   Pt. Will benefit from skilled PT services to increase L knee ROM/ strength to improve pain-free mobility.    OBJECTIVE IMPAIRMENTS: Abnormal gait, decreased activity tolerance, decreased balance, decreased endurance, decreased mobility, difficulty walking, decreased ROM, decreased strength, hypomobility, impaired flexibility, improper body mechanics, and pain.   ACTIVITY LIMITATIONS: carrying, lifting, bending, standing, squatting, stairs, transfers, locomotion level, and caring for others  PARTICIPATION LIMITATIONS: community activity, yard work, and church  PERSONAL FACTORS: Age and  Past/current experiences are also affecting patient's functional outcome.   REHAB POTENTIAL: Good  CLINICAL DECISION MAKING: Evolving/moderate complexity  EVALUATION COMPLEXITY: Moderate   GOALS: Goals reviewed with patient? Yes  SHORT TERM GOALS: Target date: 02/17/23 Pt. Independent with HEP to increase L knee AROM (0 to >115 deg.) to improve functional mobility.   Baseline:  see above Goal status: Partially met  LONG TERM GOALS: Target date: 03/24/23  Pt. Will increase FOTO to 61 to improve pain-free mobility.  Baseline: initial 27 Goal status: INITIAL  2.  Pt. Able to ambulate community distance with more normalized gait pattern and on assistive device safely.   Baseline:  L antalgic gait with RW Goal status: INITIAL  3.  Pt. Able to manage wife's w/c into and out of trunk of car to be able to attend church/ events.   Baseline: unable to lift w/c at this time Goal status: INITIAL  PLAN:  PT FREQUENCY: 2x/week  PT DURATION: 8 weeks  PLANNED INTERVENTIONS: Therapeutic exercises, Therapeutic activity, Neuromuscular re-education, Balance training, Gait training, Patient/Family education, Self Care, Joint mobilization, Electrical stimulation, Cryotherapy, Moist heat, scar mobilization, Manual therapy, and Re-evaluation  PLAN FOR NEXT SESSION: Progress L knee ROM.  Discuss MD f/u.   Cammie Mcgee, PT, DPT # [id number] 03/01/2023, 12:58 PM

## 2023-03-03 ENCOUNTER — Ambulatory Visit: Payer: Medicare HMO | Admitting: Physical Therapy

## 2023-03-03 DIAGNOSIS — Z96652 Presence of left artificial knee joint: Secondary | ICD-10-CM | POA: Diagnosis not present

## 2023-03-03 DIAGNOSIS — M6281 Muscle weakness (generalized): Secondary | ICD-10-CM

## 2023-03-03 DIAGNOSIS — R269 Unspecified abnormalities of gait and mobility: Secondary | ICD-10-CM

## 2023-03-03 DIAGNOSIS — M25662 Stiffness of left knee, not elsewhere classified: Secondary | ICD-10-CM

## 2023-03-08 ENCOUNTER — Ambulatory Visit: Payer: Medicare HMO | Admitting: Physical Therapy

## 2023-03-08 DIAGNOSIS — Z96652 Presence of left artificial knee joint: Secondary | ICD-10-CM

## 2023-03-08 DIAGNOSIS — M25662 Stiffness of left knee, not elsewhere classified: Secondary | ICD-10-CM

## 2023-03-08 DIAGNOSIS — R269 Unspecified abnormalities of gait and mobility: Secondary | ICD-10-CM

## 2023-03-08 DIAGNOSIS — M6281 Muscle weakness (generalized): Secondary | ICD-10-CM

## 2023-03-08 NOTE — Therapy (Signed)
OUTPATIENT PHYSICAL THERAPY LOWER EXTREMITY TREATMENT Physical Therapy Progress Note  Dates of reporting period  01/27/23   to   03/03/23   Patient Name: Melvin Johnson MRN: [id number] DOB:24-Nov-1940, 82 y.o., male Today's Date: 03/03/23  END OF SESSION:  PT End of Session - 03/08/23 1231     Visit Number 10    Number of Visits 16    Date for PT Re-Evaluation 03/24/23    PT Start Time 1026    PT Stop Time 1123    PT Time Calculation (min) 57 min             Past Medical History:  Diagnosis Date   Arthritis    At risk for sleep apnea    STOP-BANG=4        SENT TO PCP 06-26-2014   Coronary artery disease    no cardiologist for several years , sees pcp  dr mark Hyacinth Meeker   History of MI (myocardial infarction)    may 1999   Hyperlipidemia    Hypertension    Mild acid reflux    Neuropathy of both feet    burning sensation   Nocturia    Prostate cancer (HCC) 03/2014   Gleason 3+3=6, seed implant 07/03/14   S/P CABG x 4    Seasonal allergies    Type 2 diabetes mellitus (HCC)    Wears dentures    UPPER   Wears glasses    Past Surgical History:  Procedure Laterality Date   CARDIAC CATHETERIZATION  1999  ([place])   CATARACT EXTRACTION W/PHACO Right 03/02/2021   Procedure: CATARACT EXTRACTION PHACO AND INTRAOCULAR LENS PLACEMENT (IOC) RIGHT DIABETIC 5.79 00:45.2;  Surgeon: Nevada Crane, MD;  Location: [facility];  Service: Ophthalmology;  Laterality: Right;   CATARACT EXTRACTION W/PHACO Left 03/16/2021   Procedure: CATARACT EXTRACTION PHACO AND INTRAOCULAR LENS PLACEMENT (IOC) LEFT DIABETIC 5.90 00:41.5;  Surgeon: Nevada Crane, MD;  Location: [facility];  Service: Ophthalmology;  Laterality: Left;   CORONARY ARTERY BYPASS GRAFT  2003   dr vantright   4 VESSEL   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   RADIOACTIVE SEED IMPLANT N/A 07/03/2014   Procedure: RADIOACTIVE SEED IMPLANT;  Surgeon: Valetta Fuller, MD;  Location: [facility];  Service: Urology;  Laterality: N/A;   TONSILLECTOMY  as child   Patient Active Problem List   Diagnosis Date Noted   Malignant neoplasm of prostate (HCC) 05/02/2014    PCP: Danella Penton, MD  REFERRING PROVIDER: Lyndle Herrlich, MD  REFERRING DIAG: L total knee replacement  THERAPY DIAG:  History of total knee replacement, left  Joint stiffness of knee, left  Muscle weakness  Gait difficulty  Rationale for Evaluation and Treatment: Rehabilitation  ONSET DATE: 01/20/23 (surgery date).    SUBJECTIVE:   SUBJECTIVE STATEMENT:   EVALUATION Pt. S/p L TKA on 01/20/23.  Pt. Known well to PT clinic.  Pt. States he is in a lot of pain (8/10) and is having more pain in s/p L knee surgery than he did during R TKA.    PERTINENT HISTORY: See MD notes. PAIN:  Are you having pain? Yes: NPRS scale: 8/10 Pain location: L knee Pain description: burning Aggravating factors: knee flexion/ walking Relieving factors: ice/ medications  PRECAUTIONS: None  WEIGHT BEARING RESTRICTIONS: No  FALLS:  Has patient fallen in last 6 months? Yes. Number of falls 1 (no injury)  LIVING ENVIRONMENT: Lives with: lives with their spouse  Lives in: House/apartment Has following equipment at home: Single point cane and Environmental consultant - 2 wheeled  OCCUPATION: retired  PLOF: Independent  PATIENT GOALS: Increase L knee ROM/ strength and walking pain-free  NEXT MD VISIT: 02/01/23  OBJECTIVE:   PATIENT SURVEYS:  FOTO initial 27/ goal 31  COGNITION: Overall cognitive status: Within functional limits for tasks assessed     SENSATION: WFL  EDEMA:  Circumferential: L/R joint line (45.5/39 cm), 2" superior (47/40.5 cm), mid-gastroc (36.4/34.5 cm).  L knee bandages in place.    MUSCLE LENGTH: Hamstrings: Right 55 deg; Left 50 deg  POSTURE: rounded shoulders  PALPATION: Significant tenderness around L knee/ inner thigh (bruising).  No calf tenderness.  LOWER EXTREMITY ROM:  Active ROM  Right eval Left eval  Hip flexion [facility]  Hip extension    Hip abduction    Hip adduction    Hip internal rotation    Hip external rotation    Knee flexion 119 deg. 82 deg.  Knee extension -2 deg. -4 deg.  Ankle dorsiflexion    Ankle plantarflexion    Ankle inversion    Ankle eversion     (Blank rows = not tested)  L knee AA/PROM: 94 deg.  LOWER EXTREMITY MMT:  MMT Right eval Left eval  Hip flexion 4/5 4/5  Hip extension    Hip abduction 4/5 4/5  Hip adduction    Hip internal rotation    Hip external rotation    Knee flexion 5/5 4/5  Knee extension 5/5 3/5  Ankle dorsiflexion    Ankle plantarflexion    Ankle inversion    Ankle eversion     (Blank rows = not tested)  LOWER EXTREMITY SPECIAL TESTS:  N/A  FUNCTIONAL TESTS:  5 times sit to stand: TBD  GAIT: Distance walked: in clinic Assistive device utilized: Walker - 2 wheeled Level of assistance: SBA Comments: L antalgic gait with limited L hip/knee flexion during swing through phase of gait.   TODAY'S TREATMENT:                                                                                                                              DATE: 03/03/2023   Subjective:  Pt. Entered PT with use of SPC and limited L hip/knee flexion.  Pt. Remains pain focused with L knee ROM/ walking without use of SPC today.  Pt. Reports L knee pain is a 12/10 while walking into PT gym.    There.ex.:    Nustep L4 10 min. (Seat 8-7) with B UE/LE.   Standing marching/ heel raises/ hamstring curls/ hip abduction 20x at //-bars with mirror feedback.  Supine ball ex.: knee to chest/ SLR/ bridging 10x2.    Seated LAQ/marching 20x.   Walking around PT clinic/ hallway/ outside with no assistive device and more consistent step pattern.  Discussed importance of walking every hour.       Manual tx.:   Supine L patellar mobs (  all planes).  L medial knee pitting edema present.    Supine L knee AA/PROM flexion and extension 10x  each with holds.  L knee/ thigh/ calf STM in supine.       Supine L knee AROM in supine 10x.    Pt. Will ice L knee at home.     PATIENT EDUCATION:  Education details: [email or web address] Person educated: Patient Education method: Explanation, Demonstration, and Handouts Education comprehension: verbalized understanding and returned demonstration  HOME EXERCISE PROGRAM: Access Code: [email or web address] URL: [email or web address] Date: 01/27/2023 Prepared by: Dorene Grebe Exercises - Supine Quad Set - 2 x daily - 7 x weekly - 1 sets - 10 reps - Supine Heel Slide with Strap - 2 x daily - 7 x weekly - 1 sets - 10 reps - Seated Long Arc Quad - 2 x daily - 7 x weekly - 1 sets - 10 reps - Seated Knee Flexion Extension AAROM with Overpressure - 2 x daily - 7 x weekly - 1 sets - 10 reps - Small Range Straight Leg Raise - 2 x daily - 7 x weekly - 1 sets - 10 reps   ASSESSMENT:  CLINICAL IMPRESSION: Pt. Remains pain limited/ focused with L knee ROM and progressing functional mobility.  Pt. Has 110 deg. L knee AROM in supine position after manual stretches/ patellar mobs.  PT cued pt. To increase L hip/knee flexion during swing through phase of gait to prevent L knee joint stiffness.  See updated goals.   Pt. Will benefit from skilled PT services to increase L knee ROM/ strength to improve pain-free mobility.    OBJECTIVE IMPAIRMENTS: Abnormal gait, decreased activity tolerance, decreased balance, decreased endurance, decreased mobility, difficulty walking, decreased ROM, decreased strength, hypomobility, impaired flexibility, improper body mechanics, and pain.   ACTIVITY LIMITATIONS: carrying, lifting, bending, standing, squatting, stairs, transfers, locomotion level, and caring for others  PARTICIPATION LIMITATIONS: community activity, yard work, and church  PERSONAL FACTORS: Age and Past/current experiences are also affecting patient's functional outcome.   REHAB POTENTIAL: Good  CLINICAL DECISION  MAKING: Evolving/moderate complexity  EVALUATION COMPLEXITY: Moderate   GOALS: Goals reviewed with patient? Yes  SHORT TERM GOALS: Target date: 02/17/23 Pt. Independent with HEP to increase L knee AROM (0 to >115 deg.) to improve functional mobility.   Baseline:  see above Goal status: Partially met  LONG TERM GOALS: Target date: 03/24/23  Pt. Will increase FOTO to 61 to improve pain-free mobility.  Baseline: initial 27 Goal status: INITIAL  2.  Pt. Able to ambulate community distance with more normalized gait pattern and on assistive device safely.   Baseline:  L antalgic gait with RW.  5/9: ambulates without SPC with limited hip/knee flexion Goal status: Partially met  3.  Pt. Able to manage wife's w/c into and out of trunk of car to be able to attend church/ events.   Baseline: unable to lift w/c at this time.  5/9: putting w/c in/out of trunk of car.  Goal status: Goal met.  PLAN:  PT FREQUENCY: 2x/week  PT DURATION: 8 weeks  PLANNED INTERVENTIONS: Therapeutic exercises, Therapeutic activity, Neuromuscular re-education, Balance training, Gait training, Patient/Family education, Self Care, Joint mobilization, Electrical stimulation, Cryotherapy, Moist heat, scar mobilization, Manual therapy, and Re-evaluation  PLAN FOR NEXT SESSION: Progress L knee ROM.    Cammie Mcgee, PT, DPT # [phone] 03/08/2023, 12:35 PM

## 2023-03-10 ENCOUNTER — Ambulatory Visit: Payer: Medicare HMO | Admitting: Physical Therapy

## 2023-03-10 DIAGNOSIS — Z96652 Presence of left artificial knee joint: Secondary | ICD-10-CM

## 2023-03-10 DIAGNOSIS — R269 Unspecified abnormalities of gait and mobility: Secondary | ICD-10-CM

## 2023-03-10 DIAGNOSIS — M6281 Muscle weakness (generalized): Secondary | ICD-10-CM

## 2023-03-10 DIAGNOSIS — M25662 Stiffness of left knee, not elsewhere classified: Secondary | ICD-10-CM

## 2023-03-12 ENCOUNTER — Encounter: Payer: Self-pay | Admitting: Physical Therapy

## 2023-03-12 NOTE — Therapy (Signed)
OUTPATIENT PHYSICAL THERAPY LOWER EXTREMITY TREATMENT  Patient Name: Melvin Johnson MRN: [id number] DOB:01/11/1941, 82 y.o., male Today's Date: 03/10/23  END OF SESSION:  PT End of Session - 03/12/23 1908     Visit Number 12    Number of Visits 16    Date for PT Re-Evaluation 03/24/23    PT Start Time 1024    PT Stop Time 1120    PT Time Calculation (min) 56 min             Past Medical History:  Diagnosis Date   Arthritis    At risk for sleep apnea    STOP-BANG=4        SENT TO PCP 06-26-2014   Coronary artery disease    no cardiologist for several years , sees pcp  dr mark Hyacinth Meeker   History of MI (myocardial infarction)    may 1999   Hyperlipidemia    Hypertension    Mild acid reflux    Neuropathy of both feet    burning sensation   Nocturia    Prostate cancer (HCC) 03/2014   Gleason 3+3=6, seed implant 07/03/14   S/P CABG x 4    Seasonal allergies    Type 2 diabetes mellitus (HCC)    Wears dentures    UPPER   Wears glasses    Past Surgical History:  Procedure Laterality Date   CARDIAC CATHETERIZATION  1999  ([place])   CATARACT EXTRACTION W/PHACO Right 03/02/2021   Procedure: CATARACT EXTRACTION PHACO AND INTRAOCULAR LENS PLACEMENT (IOC) RIGHT DIABETIC 5.79 00:45.2;  Surgeon: Nevada Crane, MD;  Location: [facility];  Service: Ophthalmology;  Laterality: Right;   CATARACT EXTRACTION W/PHACO Left 03/16/2021   Procedure: CATARACT EXTRACTION PHACO AND INTRAOCULAR LENS PLACEMENT (IOC) LEFT DIABETIC 5.90 00:41.5;  Surgeon: Nevada Crane, MD;  Location: [facility];  Service: Ophthalmology;  Laterality: Left;   CORONARY ARTERY BYPASS GRAFT  2003   dr vantright   4 VESSEL   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   RADIOACTIVE SEED IMPLANT N/A 07/03/2014   Procedure: RADIOACTIVE SEED IMPLANT;  Surgeon: Valetta Fuller, MD;  Location: [facility];  Service: Urology;  Laterality: N/A;   TONSILLECTOMY  as child   Patient  Active Problem List   Diagnosis Date Noted   Malignant neoplasm of prostate (HCC) 05/02/2014    PCP: Danella Penton, MD  REFERRING PROVIDER: Lyndle Herrlich, MD  REFERRING DIAG: L total knee replacement  THERAPY DIAG:  History of total knee replacement, left  Joint stiffness of knee, left  Muscle weakness  Gait difficulty  Rationale for Evaluation and Treatment: Rehabilitation  ONSET DATE: 01/20/23 (surgery date).    SUBJECTIVE:   SUBJECTIVE STATEMENT:   EVALUATION Pt. S/p L TKA on 01/20/23.  Pt. Known well to PT clinic.  Pt. States he is in a lot of pain (8/10) and is having more pain in s/p L knee surgery than he did during R TKA.    PERTINENT HISTORY: See MD notes. PAIN:  Are you having pain? Yes: NPRS scale: 8/10 Pain location: L knee Pain description: burning Aggravating factors: knee flexion/ walking Relieving factors: ice/ medications  PRECAUTIONS: None  WEIGHT BEARING RESTRICTIONS: No  FALLS:  Has patient fallen in last 6 months? Yes. Number of falls 1 (no injury)  LIVING ENVIRONMENT: Lives with: lives with their spouse Lives in: House/apartment Has following equipment at home: Single point cane and Environmental consultant - 2 wheeled  OCCUPATION:  retired  PLOF: Independent  PATIENT GOALS: Increase L knee ROM/ strength and walking pain-free  NEXT MD VISIT: 02/01/23  OBJECTIVE:   PATIENT SURVEYS:  FOTO initial 27/ goal 76  COGNITION: Overall cognitive status: Within functional limits for tasks assessed     SENSATION: WFL  EDEMA:  Circumferential: L/R joint line (45.5/39 cm), 2" superior (47/40.5 cm), mid-gastroc (36.4/34.5 cm).  L knee bandages in place.    MUSCLE LENGTH: Hamstrings: Right 55 deg; Left 50 deg  POSTURE: rounded shoulders  PALPATION: Significant tenderness around L knee/ inner thigh (bruising).  No calf tenderness.  LOWER EXTREMITY ROM:  Active ROM Right eval Left eval  Hip flexion [facility]  Hip extension    Hip abduction    Hip  adduction    Hip internal rotation    Hip external rotation    Knee flexion 119 deg. 82 deg.  Knee extension -2 deg. -4 deg.  Ankle dorsiflexion    Ankle plantarflexion    Ankle inversion    Ankle eversion     (Blank rows = not tested)  L knee AA/PROM: 94 deg.  LOWER EXTREMITY MMT:  MMT Right eval Left eval  Hip flexion 4/5 4/5  Hip extension    Hip abduction 4/5 4/5  Hip adduction    Hip internal rotation    Hip external rotation    Knee flexion 5/5 4/5  Knee extension 5/5 3/5  Ankle dorsiflexion    Ankle plantarflexion    Ankle inversion    Ankle eversion     (Blank rows = not tested)  LOWER EXTREMITY SPECIAL TESTS:  N/A  FUNCTIONAL TESTS:  5 times sit to stand: TBD  GAIT: Distance walked: in clinic Assistive device utilized: Walker - 2 wheeled Level of assistance: SBA Comments: L antalgic gait with limited L hip/knee flexion during swing through phase of gait.   TODAY'S TREATMENT:                                                                                                                              DATE: 03/10/2023   Subjective:  Pt. Entered PT with use of SPC and states he is doing well today.  Pt. Reports no increase c/o knee pain after last tx. Session.      There.ex.:    Nustep L4 10+ min. (Seat 7) with B UE/LE.   Standing L knee flexion at 2nd step with UE assist on handrail 10x.    TG: bilateral squats 20x/ heel raises 20x.  Pain tolerable range with knee flexion  Seated marching/ LAQ 20x each.  Supine ball ex.: knee to chest/ SLR/ bridging 10x2.    Walking around PT clinic/ hallway/ outside with no assistive device and more consistent step pattern.  Cuing to increase knee flexion/ heel strike during step pattern.       Ascending/descending stairs with recip. Pattern and light to no UE assist on handrail (4 stairs x 6)  Manual tx.:   Supine L patellar mobs (all planes).  STM to L distal quad/hamstring.    Supine L knee AA/PROM flexion  and extension 10x each with holds.  L knee/ thigh/ calf STM in supine.       Supine L knee AROM: -2 to 116 deg.  Ice to L knee in supine after tx.     PATIENT EDUCATION:  Education details: [email or web address] Person educated: Patient Education method: Explanation, Demonstration, and Handouts Education comprehension: verbalized understanding and returned demonstration  HOME EXERCISE PROGRAM: Access Code: [email or web address] URL: [email or web address] Date: 01/27/2023 Prepared by: Dorene Grebe Exercises - Supine Quad Set - 2 x daily - 7 x weekly - 1 sets - 10 reps - Supine Heel Slide with Strap - 2 x daily - 7 x weekly - 1 sets - 10 reps - Seated Long Arc Quad - 2 x daily - 7 x weekly - 1 sets - 10 reps - Seated Knee Flexion Extension AAROM with Overpressure - 2 x daily - 7 x weekly - 1 sets - 10 reps - Small Range Straight Leg Raise - 2 x daily - 7 x weekly - 1 sets - 10 reps   ASSESSMENT:  CLINICAL IMPRESSION: Marked increase in L knee AROM (-2 to 116 deg.) after there.ex./ manual tx.  PT cued pt. To increase L hip/knee flexion during swing through phase of gait to prevent L knee joint stiffness, esp. Without use of SPC.  Minimal L knee pain with palpation over medial aspect of knee.   Pt. Will benefit from skilled PT services to increase L knee ROM/ strength to improve pain-free mobility.    OBJECTIVE IMPAIRMENTS: Abnormal gait, decreased activity tolerance, decreased balance, decreased endurance, decreased mobility, difficulty walking, decreased ROM, decreased strength, hypomobility, impaired flexibility, improper body mechanics, and pain.   ACTIVITY LIMITATIONS: carrying, lifting, bending, standing, squatting, stairs, transfers, locomotion level, and caring for others  PARTICIPATION LIMITATIONS: community activity, yard work, and church  PERSONAL FACTORS: Age and Past/current experiences are also affecting patient's functional outcome.   REHAB POTENTIAL: Good  CLINICAL DECISION MAKING:  Evolving/moderate complexity  EVALUATION COMPLEXITY: Moderate   GOALS: Goals reviewed with patient? Yes  SHORT TERM GOALS: Target date: 02/17/23 Pt. Independent with HEP to increase L knee AROM (0 to >115 deg.) to improve functional mobility.   Baseline:  see above Goal status: Partially met  LONG TERM GOALS: Target date: 03/24/23  Pt. Will increase FOTO to 61 to improve pain-free mobility.  Baseline: initial 27 Goal status: INITIAL  2.  Pt. Able to ambulate community distance with more normalized gait pattern and on assistive device safely.   Baseline:  L antalgic gait with RW.  5/9: ambulates without SPC with limited hip/knee flexion Goal status: Partially met  3.  Pt. Able to manage wife's w/c into and out of trunk of car to be able to attend church/ events.   Baseline: unable to lift w/c at this time.  5/9: putting w/c in/out of trunk of car.  Goal status: Goal met.  PLAN:  PT FREQUENCY: 2x/week  PT DURATION: 8 weeks  PLANNED INTERVENTIONS: Therapeutic exercises, Therapeutic activity, Neuromuscular re-education, Balance training, Gait training, Patient/Family education, Self Care, Joint mobilization, Electrical stimulation, Cryotherapy, Moist heat, scar mobilization, Manual therapy, and Re-evaluation  PLAN FOR NEXT SESSION: Progress L knee ROM.  Check FOTO  Cammie Mcgee, PT, DPT # [id number] 03/12/2023, 7:09 PM

## 2023-03-12 NOTE — Therapy (Signed)
OUTPATIENT PHYSICAL THERAPY LOWER EXTREMITY TREATMENT  Patient Name: Melvin Johnson MRN: [id number] DOB:[birth year], 82 y.o., male Today's Date: 03/08/23  END OF SESSION:  PT End of Session - 03/12/23 1614     Visit Number 11    Number of Visits 16    Date for PT Re-Evaluation 03/24/23    PT Start Time 1022    PT Stop Time 1126    PT Time Calculation (min) 64 min             Past Medical History:  Diagnosis Date   Arthritis    At risk for sleep apnea    STOP-BANG=4        SENT TO PCP 06-26-2014   Coronary artery disease    no cardiologist for several years , sees pcp  dr mark Hyacinth Meeker   History of MI (myocardial infarction)    may 1999   Hyperlipidemia    Hypertension    Mild acid reflux    Neuropathy of both feet    burning sensation   Nocturia    Prostate cancer (HCC) 03/2014   Gleason 3+3=6, seed implant 07/03/14   S/P CABG x 4    Seasonal allergies    Type 2 diabetes mellitus (HCC)    Wears dentures    UPPER   Wears glasses    Past Surgical History:  Procedure Laterality Date   CARDIAC CATHETERIZATION  1999  ([place])   CATARACT EXTRACTION W/PHACO Right 03/02/2021   Procedure: CATARACT EXTRACTION PHACO AND INTRAOCULAR LENS PLACEMENT (IOC) RIGHT DIABETIC 5.79 00:45.2;  Surgeon: Nevada Crane, MD;  Location: [facility];  Service: Ophthalmology;  Laterality: Right;   CATARACT EXTRACTION W/PHACO Left 03/16/2021   Procedure: CATARACT EXTRACTION PHACO AND INTRAOCULAR LENS PLACEMENT (IOC) LEFT DIABETIC 5.90 00:41.5;  Surgeon: Nevada Crane, MD;  Location: [facility];  Service: Ophthalmology;  Laterality: Left;   CORONARY ARTERY BYPASS GRAFT  2003   dr vantright   4 VESSEL   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   RADIOACTIVE SEED IMPLANT N/A 07/03/2014   Procedure: RADIOACTIVE SEED IMPLANT;  Surgeon: Valetta Fuller, MD;  Location: [facility];  Service: Urology;  Laterality: N/A;   TONSILLECTOMY  as child   Patient  Active Problem List   Diagnosis Date Noted   Malignant neoplasm of prostate (HCC) 05/02/2014    PCP: Danella Penton, MD  REFERRING PROVIDER: Lyndle Herrlich, MD  REFERRING DIAG: L total knee replacement  THERAPY DIAG:  History of total knee replacement, left  Joint stiffness of knee, left  Muscle weakness  Gait difficulty  Rationale for Evaluation and Treatment: Rehabilitation  ONSET DATE: 01/20/23 (surgery date).    SUBJECTIVE:   SUBJECTIVE STATEMENT:   EVALUATION Pt. S/p L TKA on 01/20/23.  Pt. Known well to PT clinic.  Pt. States he is in a lot of pain (8/10) and is having more pain in s/p L knee surgery than he did during R TKA.    PERTINENT HISTORY: See MD notes. PAIN:  Are you having pain? Yes: NPRS scale: 8/10 Pain location: L knee Pain description: burning Aggravating factors: knee flexion/ walking Relieving factors: ice/ medications  PRECAUTIONS: None  WEIGHT BEARING RESTRICTIONS: No  FALLS:  Has patient fallen in last 6 months? Yes. Number of falls 1 (no injury)  LIVING ENVIRONMENT: Lives with: lives with their spouse Lives in: House/apartment Has following equipment at home: Single point cane and Environmental consultant - 2 wheeled  OCCUPATION:  retired  PLOF: Independent  PATIENT GOALS: Increase L knee ROM/ strength and walking pain-free  NEXT MD VISIT: 02/01/23  OBJECTIVE:   PATIENT SURVEYS:  FOTO initial 27/ goal 61  COGNITION: Overall cognitive status: Within functional limits for tasks assessed     SENSATION: WFL  EDEMA:  Circumferential: L/R joint line (45.5/39 cm), 2" superior (47/40.5 cm), mid-gastroc (36.4/34.5 cm).  L knee bandages in place.    MUSCLE LENGTH: Hamstrings: Right 55 deg; Left 50 deg  POSTURE: rounded shoulders  PALPATION: Significant tenderness around L knee/ inner thigh (bruising).  No calf tenderness.  LOWER EXTREMITY ROM:  Active ROM Right eval Left eval  Hip flexion [facility]  Hip extension    Hip abduction    Hip  adduction    Hip internal rotation    Hip external rotation    Knee flexion 119 deg. 82 deg.  Knee extension -2 deg. -4 deg.  Ankle dorsiflexion    Ankle plantarflexion    Ankle inversion    Ankle eversion     (Blank rows = not tested)  L knee AA/PROM: 94 deg.  LOWER EXTREMITY MMT:  MMT Right eval Left eval  Hip flexion 4/5 4/5  Hip extension    Hip abduction 4/5 4/5  Hip adduction    Hip internal rotation    Hip external rotation    Knee flexion 5/5 4/5  Knee extension 5/5 3/5  Ankle dorsiflexion    Ankle plantarflexion    Ankle inversion    Ankle eversion     (Blank rows = not tested)  LOWER EXTREMITY SPECIAL TESTS:  N/A  FUNCTIONAL TESTS:  5 times sit to stand: TBD  GAIT: Distance walked: in clinic Assistive device utilized: Walker - 2 wheeled Level of assistance: SBA Comments: L antalgic gait with limited L hip/knee flexion during swing through phase of gait.   TODAY'S TREATMENT:                                                                                                                              DATE: 03/08/2023   Subjective:  Pt. Entered PT with use of SPC and carrying wife's prosthetic leg.  Pt. Continues to report pain and discomfort in knee.    There.ex.:    Nustep L4 10 min. (Seat 8-7) with B UE/LE.   Seated marching/ LAQ 20x each.  Supine ball ex.: knee to chest/ SLR/ bridging 10x2.  Standing marching/ hip abduction/ step ups 10x2 each.  Walking around PT clinic/ hallway/ outside with no assistive device and more consistent step pattern.  Cuing to increase knee flexion/ heel strike during step pattern.    Obstacle course in hallway: Airex/ hurdles.  SBA for cuing with and without SPC.       Manual tx.:   Supine L patellar mobs (all planes).     Supine L knee AA/PROM flexion and extension 10x each with holds.  L knee/ thigh/ calf STM  in supine.       Pt. Will ice L knee at home.     PATIENT EDUCATION:  Education details:  [email or web address] Person educated: Patient Education method: Explanation, Demonstration, and Handouts Education comprehension: verbalized understanding and returned demonstration  HOME EXERCISE PROGRAM: Access Code: [email or web address] URL: [email or web address] Date: 01/27/2023 Prepared by: Dorene Grebe Exercises - Supine Quad Set - 2 x daily - 7 x weekly - 1 sets - 10 reps - Supine Heel Slide with Strap - 2 x daily - 7 x weekly - 1 sets - 10 reps - Seated Long Arc Quad - 2 x daily - 7 x weekly - 1 sets - 10 reps - Seated Knee Flexion Extension AAROM with Overpressure - 2 x daily - 7 x weekly - 1 sets - 10 reps - Small Range Straight Leg Raise - 2 x daily - 7 x weekly - 1 sets - 10 reps   ASSESSMENT:  CLINICAL IMPRESSION: Pt. Remains pain limited/ focused with L knee ROM and progressing functional mobility.  Pt. Has 112 deg. L knee AROM in supine position after manual stretches/ patellar mobs.  PT cued pt. To increase L hip/knee flexion during swing through phase of gait to prevent L knee joint stiffness, esp. Without use of SPC.  Excellent incision healing and minimal joint line swelling.   Pt. Will benefit from skilled PT services to increase L knee ROM/ strength to improve pain-free mobility.    OBJECTIVE IMPAIRMENTS: Abnormal gait, decreased activity tolerance, decreased balance, decreased endurance, decreased mobility, difficulty walking, decreased ROM, decreased strength, hypomobility, impaired flexibility, improper body mechanics, and pain.   ACTIVITY LIMITATIONS: carrying, lifting, bending, standing, squatting, stairs, transfers, locomotion level, and caring for others  PARTICIPATION LIMITATIONS: community activity, yard work, and church  PERSONAL FACTORS: Age and Past/current experiences are also affecting patient's functional outcome.   REHAB POTENTIAL: Good  CLINICAL DECISION MAKING: Evolving/moderate complexity  EVALUATION COMPLEXITY: Moderate   GOALS: Goals reviewed with  patient? Yes  SHORT TERM GOALS: Target date: 02/17/23 Pt. Independent with HEP to increase L knee AROM (0 to >115 deg.) to improve functional mobility.   Baseline:  see above Goal status: Partially met  LONG TERM GOALS: Target date: 03/24/23  Pt. Will increase FOTO to 61 to improve pain-free mobility.  Baseline: initial 27 Goal status: INITIAL  2.  Pt. Able to ambulate community distance with more normalized gait pattern and on assistive device safely.   Baseline:  L antalgic gait with RW.  5/9: ambulates without SPC with limited hip/knee flexion Goal status: Partially met  3.  Pt. Able to manage wife's w/c into and out of trunk of car to be able to attend church/ events.   Baseline: unable to lift w/c at this time.  5/9: putting w/c in/out of trunk of car.  Goal status: Goal met.  PLAN:  PT FREQUENCY: 2x/week  PT DURATION: 8 weeks  PLANNED INTERVENTIONS: Therapeutic exercises, Therapeutic activity, Neuromuscular re-education, Balance training, Gait training, Patient/Family education, Self Care, Joint mobilization, Electrical stimulation, Cryotherapy, Moist heat, scar mobilization, Manual therapy, and Re-evaluation  PLAN FOR NEXT SESSION: Progress L knee ROM.    Cammie Mcgee, PT, DPT # [id number] 03/12/2023, 4:17 PM

## 2023-03-15 ENCOUNTER — Ambulatory Visit: Payer: Medicare HMO | Admitting: Physical Therapy

## 2023-03-17 ENCOUNTER — Ambulatory Visit: Payer: Medicare HMO | Admitting: Physical Therapy

## 2023-03-22 ENCOUNTER — Ambulatory Visit: Payer: Medicare HMO | Admitting: Physical Therapy

## 2023-03-22 DIAGNOSIS — M6281 Muscle weakness (generalized): Secondary | ICD-10-CM

## 2023-03-22 DIAGNOSIS — M25662 Stiffness of left knee, not elsewhere classified: Secondary | ICD-10-CM

## 2023-03-22 DIAGNOSIS — R269 Unspecified abnormalities of gait and mobility: Secondary | ICD-10-CM

## 2023-03-22 DIAGNOSIS — Z96652 Presence of left artificial knee joint: Secondary | ICD-10-CM | POA: Diagnosis not present

## 2023-03-23 NOTE — Therapy (Signed)
OUTPATIENT PHYSICAL THERAPY LOWER EXTREMITY TREATMENT  Patient Name: Melvin Johnson MRN: [id number] DOB:January 18, 1941, 82 y.o., male Today's Date: 03/22/23  END OF SESSION:  PT End of Session - 03/23/23 1752     Visit Number 13    Number of Visits 16    Date for PT Re-Evaluation 03/24/23    PT Start Time 1025    PT Stop Time 1130    PT Time Calculation (min) 65 min             Past Medical History:  Diagnosis Date   Arthritis    At risk for sleep apnea    STOP-BANG=4        SENT TO PCP 06-26-2014   Coronary artery disease    no cardiologist for several years , sees pcp  dr mark Hyacinth Meeker   History of MI (myocardial infarction)    may 1999   Hyperlipidemia    Hypertension    Mild acid reflux    Neuropathy of both feet    burning sensation   Nocturia    Prostate cancer (HCC) 03/2014   Gleason 3+3=6, seed implant 07/03/14   S/P CABG x 4    Seasonal allergies    Type 2 diabetes mellitus (HCC)    Wears dentures    UPPER   Wears glasses    Past Surgical History:  Procedure Laterality Date   CARDIAC CATHETERIZATION  1999  ([place])   CATARACT EXTRACTION W/PHACO Right 03/02/2021   Procedure: CATARACT EXTRACTION PHACO AND INTRAOCULAR LENS PLACEMENT (IOC) RIGHT DIABETIC 5.79 00:45.2;  Surgeon: Nevada Crane, MD;  Location: [facility];  Service: Ophthalmology;  Laterality: Right;   CATARACT EXTRACTION W/PHACO Left 03/16/2021   Procedure: CATARACT EXTRACTION PHACO AND INTRAOCULAR LENS PLACEMENT (IOC) LEFT DIABETIC 5.90 00:41.5;  Surgeon: Nevada Crane, MD;  Location: [facility];  Service: Ophthalmology;  Laterality: Left;   CORONARY ARTERY BYPASS GRAFT  2003   dr vantright   4 VESSEL   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   RADIOACTIVE SEED IMPLANT N/A 07/03/2014   Procedure: RADIOACTIVE SEED IMPLANT;  Surgeon: Valetta Fuller, MD;  Location: [facility];  Service: Urology;  Laterality: N/A;   TONSILLECTOMY  as child   Patient  Active Problem List   Diagnosis Date Noted   Malignant neoplasm of prostate (HCC) 05/02/2014    PCP: Danella Penton, MD  REFERRING PROVIDER: Lyndle Herrlich, MD  REFERRING DIAG: L total knee replacement  THERAPY DIAG:  History of total knee replacement, left  Joint stiffness of knee, left  Muscle weakness  Gait difficulty  Rationale for Evaluation and Treatment: Rehabilitation  ONSET DATE: 01/20/23 (surgery date).    SUBJECTIVE:   SUBJECTIVE STATEMENT:   EVALUATION Pt. S/p L TKA on 01/20/23.  Pt. Known well to PT clinic.  Pt. States he is in a lot of pain (8/10) and is having more pain in s/p L knee surgery than he did during R TKA.    PERTINENT HISTORY: See MD notes. PAIN:  Are you having pain? Yes: NPRS scale: 8/10 Pain location: L knee Pain description: burning Aggravating factors: knee flexion/ walking Relieving factors: ice/ medications  PRECAUTIONS: None  WEIGHT BEARING RESTRICTIONS: No  FALLS:  Has patient fallen in last 6 months? Yes. Number of falls 1 (no injury)  LIVING ENVIRONMENT: Lives with: lives with their spouse Lives in: House/apartment Has following equipment at home: Single point cane and Environmental consultant - 2 wheeled  OCCUPATION:  retired  PLOF: Independent  PATIENT GOALS: Increase L knee ROM/ strength and walking pain-free  NEXT MD VISIT: 02/01/23  OBJECTIVE:   PATIENT SURVEYS:  FOTO initial 27/ goal 87  COGNITION: Overall cognitive status: Within functional limits for tasks assessed     SENSATION: WFL  EDEMA:  Circumferential: L/R joint line (45.5/39 cm), 2" superior (47/40.5 cm), mid-gastroc (36.4/34.5 cm).  L knee bandages in place.    MUSCLE LENGTH: Hamstrings: Right 55 deg; Left 50 deg  POSTURE: rounded shoulders  PALPATION: Significant tenderness around L knee/ inner thigh (bruising).  No calf tenderness.  LOWER EXTREMITY ROM:  Active ROM Right eval Left eval  Hip flexion [facility]  Hip extension    Hip abduction    Hip  adduction    Hip internal rotation    Hip external rotation    Knee flexion 119 deg. 82 deg.  Knee extension -2 deg. -4 deg.  Ankle dorsiflexion    Ankle plantarflexion    Ankle inversion    Ankle eversion     (Blank rows = not tested)  L knee AA/PROM: 94 deg.  LOWER EXTREMITY MMT:  MMT Right eval Left eval  Hip flexion 4/5 4/5  Hip extension    Hip abduction 4/5 4/5  Hip adduction    Hip internal rotation    Hip external rotation    Knee flexion 5/5 4/5  Knee extension 5/5 3/5  Ankle dorsiflexion    Ankle plantarflexion    Ankle inversion    Ankle eversion     (Blank rows = not tested)  LOWER EXTREMITY SPECIAL TESTS:  N/A  FUNCTIONAL TESTS:  5 times sit to stand: TBD  GAIT: Distance walked: in clinic Assistive device utilized: Walker - 2 wheeled Level of assistance: SBA Comments: L antalgic gait with limited L hip/knee flexion during swing through phase of gait.   TODAY'S TREATMENT:                                                                                                                              DATE: 03/22/2023   Subjective:  Pt. Continues to c/o persistent L knee pain (describes pain over L lateral knee joint/ infrapatellar tendon down shin).  No subjective pain score pain given and pt. States he is not sleeping well.  Pt. Wakes up every 2 hours and has to go to recliner chair with blanket.  Pt. Currently taking Tramadol for pain.      There.ex.:    Nustep L4 10+ min. (Seat 8-7) with B UE/LE.   Standing L knee flexion at 2nd step with UE assist on handrail 10x.  Standing marching/ hip abduction/ heel raises 20x.    Seated marching/ LAQ 20x each.    Walking around PT clinic/ hallway/ outside with no assistive device and more consistent step pattern.  Cuing to increase knee flexion/ heel strike during step pattern.       Ascending/descending stairs with recip.  Pattern and light to no UE assist on handrail (4 stairs x 5)    Manual tx.:   Supine  L patellar mobs (all planes).  STM to L distal quad/hamstring.  Palpation over L lateral knee/ distal quad and hamstring.  Minimal to no swelling noted.  Tenderness over L lateral knee joint.    Supine L knee AA/PROM flexion and extension 10x each with holds.    Supine L knee AROM: -2 to 116 deg.  No ice today.     PATIENT EDUCATION:  Education details: [email or web address] Person educated: Patient Education method: Explanation, Demonstration, and Handouts Education comprehension: verbalized understanding and returned demonstration  HOME EXERCISE PROGRAM: Access Code: [email or web address] URL: [email or web address] Date: 01/27/2023 Prepared by: Dorene Grebe Exercises - Supine Quad Set - 2 x daily - 7 x weekly - 1 sets - 10 reps - Supine Heel Slide with Strap - 2 x daily - 7 x weekly - 1 sets - 10 reps - Seated Long Arc Quad - 2 x daily - 7 x weekly - 1 sets - 10 reps - Seated Knee Flexion Extension AAROM with Overpressure - 2 x daily - 7 x weekly - 1 sets - 10 reps - Small Range Straight Leg Raise - 2 x daily - 7 x weekly - 1 sets - 10 reps   ASSESSMENT:  CLINICAL IMPRESSION: Pt. Progressing well towards all PT goals and doing well with L knee ROM/ strength.  Pts. Primary issue is pain mgmt. At rest and during walking.  Pt. Is safe walking on level surfaces with no assistive device and benefits from [facility] with grassy/ uneven terrain.  No change to HEP and pt. Instructed to remain active with walking/ daily HEP.  Pt. Will benefit from skilled PT services to increase L knee ROM/ strength to improve pain-free mobility.    OBJECTIVE IMPAIRMENTS: Abnormal gait, decreased activity tolerance, decreased balance, decreased endurance, decreased mobility, difficulty walking, decreased ROM, decreased strength, hypomobility, impaired flexibility, improper body mechanics, and pain.   ACTIVITY LIMITATIONS: carrying, lifting, bending, standing, squatting, stairs, transfers, locomotion level, and caring for  others  PARTICIPATION LIMITATIONS: community activity, yard work, and church  PERSONAL FACTORS: Age and Past/current experiences are also affecting patient's functional outcome.   REHAB POTENTIAL: Good  CLINICAL DECISION MAKING: Evolving/moderate complexity  EVALUATION COMPLEXITY: Moderate   GOALS: Goals reviewed with patient? Yes  SHORT TERM GOALS: Target date: 02/17/23 Pt. Independent with HEP to increase L knee AROM (0 to >115 deg.) to improve functional mobility.   Baseline:  see above Goal status: Partially met  LONG TERM GOALS: Target date: 03/24/23  Pt. Will increase FOTO to 61 to improve pain-free mobility.  Baseline: initial 27.  5/28: 61 Goal status: Goal met  2.  Pt. Able to ambulate community distance with more normalized gait pattern and on assistive device safely.   Baseline:  L antalgic gait with RW.  5/9: ambulates without SPC with limited hip/knee flexion Goal status: Partially met  3.  Pt. Able to manage wife's w/c into and out of trunk of car to be able to attend church/ events.   Baseline: unable to lift w/c at this time.  5/9: putting w/c in/out of trunk of car.  Goal status: Goal met.  PLAN:  PT FREQUENCY: 2x/week  PT DURATION: 8 weeks  PLANNED INTERVENTIONS: Therapeutic exercises, Therapeutic activity, Neuromuscular re-education, Balance training, Gait training, Patient/Family education, Self Care, Joint mobilization, Electrical stimulation, Cryotherapy, Moist heat, scar mobilization, Manual therapy, and Re-evaluation  PLAN FOR  NEXT SESSION: Progress L knee ROM/ stability.    Cammie Mcgee, PT, DPT # [phone] 03/23/2023, 5:53 PM

## 2023-03-24 ENCOUNTER — Ambulatory Visit: Payer: Medicare HMO | Admitting: Physical Therapy

## 2023-03-24 DIAGNOSIS — Z96652 Presence of left artificial knee joint: Secondary | ICD-10-CM

## 2023-03-24 DIAGNOSIS — M6281 Muscle weakness (generalized): Secondary | ICD-10-CM

## 2023-03-24 DIAGNOSIS — M25662 Stiffness of left knee, not elsewhere classified: Secondary | ICD-10-CM

## 2023-03-24 DIAGNOSIS — R269 Unspecified abnormalities of gait and mobility: Secondary | ICD-10-CM

## 2023-03-24 NOTE — Therapy (Signed)
OUTPATIENT PHYSICAL THERAPY LOWER EXTREMITY TREATMENT  Patient Name: Melvin Johnson MRN: [id number] DOB:25-Jan-1941, 82 y.o., male Today's Date: 03/24/2023  END OF SESSION:  PT End of Session - 03/24/23 1225     Visit Number 14    Number of Visits 16    Date for PT Re-Evaluation 03/24/23    PT Start Time 1018    PT Stop Time 1111    PT Time Calculation (min) 53 min             Past Medical History:  Diagnosis Date   Arthritis    At risk for sleep apnea    STOP-BANG=4        SENT TO PCP 06-26-2014   Coronary artery disease    no cardiologist for several years , sees pcp  dr mark Hyacinth Meeker   History of MI (myocardial infarction)    may 1999   Hyperlipidemia    Hypertension    Mild acid reflux    Neuropathy of both feet    burning sensation   Nocturia    Prostate cancer (HCC) 03/2014   Gleason 3+3=6, seed implant 07/03/14   S/P CABG x 4    Seasonal allergies    Type 2 diabetes mellitus (HCC)    Wears dentures    UPPER   Wears glasses    Past Surgical History:  Procedure Laterality Date   CARDIAC CATHETERIZATION  1999  ([place])   CATARACT EXTRACTION W/PHACO Right 03/02/2021   Procedure: CATARACT EXTRACTION PHACO AND INTRAOCULAR LENS PLACEMENT (IOC) RIGHT DIABETIC 5.79 00:45.2;  Surgeon: Nevada Crane, MD;  Location: [facility];  Service: Ophthalmology;  Laterality: Right;   CATARACT EXTRACTION W/PHACO Left 03/16/2021   Procedure: CATARACT EXTRACTION PHACO AND INTRAOCULAR LENS PLACEMENT (IOC) LEFT DIABETIC 5.90 00:41.5;  Surgeon: Nevada Crane, MD;  Location: [facility];  Service: Ophthalmology;  Laterality: Left;   CORONARY ARTERY BYPASS GRAFT  2003   dr vantright   4 VESSEL   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   RADIOACTIVE SEED IMPLANT N/A 07/03/2014   Procedure: RADIOACTIVE SEED IMPLANT;  Surgeon: Valetta Fuller, MD;  Location: [facility];  Service: Urology;  Laterality: N/A;   TONSILLECTOMY  as child   Patient  Active Problem List   Diagnosis Date Noted   Malignant neoplasm of prostate (HCC) 05/02/2014    PCP: Danella Penton, MD  REFERRING PROVIDER: Lyndle Herrlich, MD  REFERRING DIAG: L total knee replacement  THERAPY DIAG:  History of total knee replacement, left  Joint stiffness of knee, left  Muscle weakness  Gait difficulty  Rationale for Evaluation and Treatment: Rehabilitation  ONSET DATE: 01/20/23 (surgery date).    SUBJECTIVE:   SUBJECTIVE STATEMENT:   EVALUATION Pt. S/p L TKA on 01/20/23.  Pt. Known well to PT clinic.  Pt. States he is in a lot of pain (8/10) and is having more pain in s/p L knee surgery than he did during R TKA.    PERTINENT HISTORY: See MD notes. PAIN:  Are you having pain? Yes: NPRS scale: 8/10 Pain location: L knee Pain description: burning Aggravating factors: knee flexion/ walking Relieving factors: ice/ medications  PRECAUTIONS: None  WEIGHT BEARING RESTRICTIONS: No  FALLS:  Has patient fallen in last 6 months? Yes. Number of falls 1 (no injury)  LIVING ENVIRONMENT: Lives with: lives with their spouse Lives in: House/apartment Has following equipment at home: Single point cane and Environmental consultant - 2 wheeled  OCCUPATION:  retired  PLOF: Independent  PATIENT GOALS: Increase L knee ROM/ strength and walking pain-free  NEXT MD VISIT: 02/01/23  OBJECTIVE:   PATIENT SURVEYS:  FOTO initial 27/ goal 72  COGNITION: Overall cognitive status: Within functional limits for tasks assessed     SENSATION: WFL  EDEMA:  Circumferential: L/R joint line (45.5/39 cm), 2" superior (47/40.5 cm), mid-gastroc (36.4/34.5 cm).  L knee bandages in place.    MUSCLE LENGTH: Hamstrings: Right 55 deg; Left 50 deg  POSTURE: rounded shoulders  PALPATION: Significant tenderness around L knee/ inner thigh (bruising).  No calf tenderness.  LOWER EXTREMITY ROM:  Active ROM Right eval Left eval  Hip flexion [facility]  Hip extension    Hip abduction    Hip  adduction    Hip internal rotation    Hip external rotation    Knee flexion 119 deg. 82 deg.  Knee extension -2 deg. -4 deg.  Ankle dorsiflexion    Ankle plantarflexion    Ankle inversion    Ankle eversion     (Blank rows = not tested)  L knee AA/PROM: 94 deg.  LOWER EXTREMITY MMT:  MMT Right eval Left eval  Hip flexion 4/5 4/5  Hip extension    Hip abduction 4/5 4/5  Hip adduction    Hip internal rotation    Hip external rotation    Knee flexion 5/5 4/5  Knee extension 5/5 3/5  Ankle dorsiflexion    Ankle plantarflexion    Ankle inversion    Ankle eversion     (Blank rows = not tested)  LOWER EXTREMITY SPECIAL TESTS:  N/A  FUNCTIONAL TESTS:  5 times sit to stand: TBD  GAIT: Distance walked: in clinic Assistive device utilized: Walker - 2 wheeled Level of assistance: SBA Comments: L antalgic gait with limited L hip/knee flexion during swing through phase of gait.   TODAY'S TREATMENT:                                                                                                                              DATE: 03/24/2023   Subjective:  Pt. States he was really busy with errands yesterday and had to lift wife's w/c in/out of car 12 times.  Pt. Entered PT carrying SPC and reports minimal knee pain and states pain is persistent.  Pt. Did sleep better last night.       There.ex.:    Nustep L5 10+ min. (Seat 8-7) with B UE/LE.   Walking marching and lateral walking in //-bars 5x.  Banker.    Seated marching/ LAQ 20x each.    Walking around PT clinic/ hallway/ outside with no assistive device and more consistent step pattern.  Cuing to increase knee flexion/ heel strike during step pattern.       Ascending/descending stairs outside with recip. Pattern and light to no UE assist on handrail.     Supine L SLR/ bridging/ marching 10x2.  Manual tx.:   Supine L patellar mobs (all planes).  STM to L distal quad/hamstring.  Palpation over L lateral  knee/ distal quad and hamstring.  Minimal to no swelling noted.  Tenderness over L lateral knee joint.    Supine L knee AA/PROM flexion and extension 5x each with holds.    Supine L knee AROM: -2 to 116 deg.  No ice today.     PATIENT EDUCATION:  Education details: [email or web address] Person educated: Patient Education method: Explanation, Demonstration, and Handouts Education comprehension: verbalized understanding and returned demonstration  HOME EXERCISE PROGRAM: Access Code: [email or web address] URL: [email or web address] Date: 01/27/2023 Prepared by: Dorene Grebe Exercises - Supine Quad Set - 2 x daily - 7 x weekly - 1 sets - 10 reps - Supine Heel Slide with Strap - 2 x daily - 7 x weekly - 1 sets - 10 reps - Seated Long Arc Quad - 2 x daily - 7 x weekly - 1 sets - 10 reps - Seated Knee Flexion Extension AAROM with Overpressure - 2 x daily - 7 x weekly - 1 sets - 10 reps - Small Range Straight Leg Raise - 2 x daily - 7 x weekly - 1 sets - 10 reps   ASSESSMENT:  CLINICAL IMPRESSION: Pt. Progressing well towards all PT goals and doing well with L knee ROM/ strength.  Pts. Primary issue is pain mgmt. At rest and during walking.  Pt. Is safe walking on level surfaces with no assistive device and benefits from [facility] with grassy/ uneven terrain.  No change to HEP and pt. Instructed to remain active with walking/ daily HEP.  Pt. Will benefit from skilled PT services to increase L knee ROM/ strength to improve pain-free mobility.    OBJECTIVE IMPAIRMENTS: Abnormal gait, decreased activity tolerance, decreased balance, decreased endurance, decreased mobility, difficulty walking, decreased ROM, decreased strength, hypomobility, impaired flexibility, improper body mechanics, and pain.   ACTIVITY LIMITATIONS: carrying, lifting, bending, standing, squatting, stairs, transfers, locomotion level, and caring for others  PARTICIPATION LIMITATIONS: community activity, yard work, and church  PERSONAL FACTORS:  Age and Past/current experiences are also affecting patient's functional outcome.   REHAB POTENTIAL: Good  CLINICAL DECISION MAKING: Evolving/moderate complexity  EVALUATION COMPLEXITY: Moderate   GOALS: Goals reviewed with patient? Yes  SHORT TERM GOALS: Target date: 02/17/23 Pt. Independent with HEP to increase L knee AROM (0 to >115 deg.) to improve functional mobility.   Baseline:  see above Goal status: Partially met  LONG TERM GOALS: Target date: 03/24/23  Pt. Will increase FOTO to 61 to improve pain-free mobility.  Baseline: initial 27.  5/28: 61 Goal status: Goal met  2.  Pt. Able to ambulate community distance with more normalized gait pattern and on assistive device safely.   Baseline:  L antalgic gait with RW.  5/9: ambulates without SPC with limited hip/knee flexion Goal status: Partially met  3.  Pt. Able to manage wife's w/c into and out of trunk of car to be able to attend church/ events.   Baseline: unable to lift w/c at this time.  5/9: putting w/c in/out of trunk of car.  Goal status: Goal met.  PLAN:  PT FREQUENCY: 2x/week  PT DURATION: 8 weeks  PLANNED INTERVENTIONS: Therapeutic exercises, Therapeutic activity, Neuromuscular re-education, Balance training, Gait training, Patient/Family education, Self Care, Joint mobilization, Electrical stimulation, Cryotherapy, Moist heat, scar mobilization, Manual therapy, and Re-evaluation  PLAN FOR NEXT SESSION: Progress L knee ROM/ stability.  RECERT and discuss POC next tx. Session.  Cammie Mcgee, PT, DPT # [phone] 03/24/2023, 12:41 PM

## 2023-03-29 ENCOUNTER — Ambulatory Visit: Payer: Medicare HMO | Attending: Orthopedic Surgery | Admitting: Physical Therapy

## 2023-03-29 ENCOUNTER — Encounter: Payer: Self-pay | Admitting: Physical Therapy

## 2023-03-29 DIAGNOSIS — M25662 Stiffness of left knee, not elsewhere classified: Secondary | ICD-10-CM

## 2023-03-29 DIAGNOSIS — R269 Unspecified abnormalities of gait and mobility: Secondary | ICD-10-CM | POA: Diagnosis present

## 2023-03-29 DIAGNOSIS — Z96652 Presence of left artificial knee joint: Secondary | ICD-10-CM | POA: Diagnosis present

## 2023-03-29 DIAGNOSIS — M6281 Muscle weakness (generalized): Secondary | ICD-10-CM

## 2023-03-29 NOTE — Therapy (Signed)
OUTPATIENT PHYSICAL THERAPY LOWER EXTREMITY TREATMENT  Patient Name: Melvin Johnson MRN: [id number] DOB:1941-09-13, 82 y.o., male Today's Date: 03/29/2023  END OF SESSION:  PT End of Session - 03/29/23 1019     Visit Number 15    Number of Visits 26    Date for PT Re-Evaluation 05/10/23    PT Start Time 1019    PT Stop Time 1121    PT Time Calculation (min) 62 min             Past Medical History:  Diagnosis Date   Arthritis    At risk for sleep apnea    STOP-BANG=4        SENT TO PCP 06-26-2014   Coronary artery disease    no cardiologist for several years , sees pcp  dr mark Hyacinth Meeker   History of MI (myocardial infarction)    may 1999   Hyperlipidemia    Hypertension    Mild acid reflux    Neuropathy of both feet    burning sensation   Nocturia    Prostate cancer (HCC) 03/2014   Gleason 3+3=6, seed implant 07/03/14   S/P CABG x 4    Seasonal allergies    Type 2 diabetes mellitus (HCC)    Wears dentures    UPPER   Wears glasses    Past Surgical History:  Procedure Laterality Date   CARDIAC CATHETERIZATION  1999  ([place])   CATARACT EXTRACTION W/PHACO Right 03/02/2021   Procedure: CATARACT EXTRACTION PHACO AND INTRAOCULAR LENS PLACEMENT (IOC) RIGHT DIABETIC 5.79 00:45.2;  Surgeon: Nevada Crane, MD;  Location: [facility];  Service: Ophthalmology;  Laterality: Right;   CATARACT EXTRACTION W/PHACO Left 03/16/2021   Procedure: CATARACT EXTRACTION PHACO AND INTRAOCULAR LENS PLACEMENT (IOC) LEFT DIABETIC 5.90 00:41.5;  Surgeon: Nevada Crane, MD;  Location: [facility];  Service: Ophthalmology;  Laterality: Left;   CORONARY ARTERY BYPASS GRAFT  2003   dr vantright   4 VESSEL   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   RADIOACTIVE SEED IMPLANT N/A 07/03/2014   Procedure: RADIOACTIVE SEED IMPLANT;  Surgeon: Valetta Fuller, MD;  Location: [facility];  Service: Urology;  Laterality: N/A;   TONSILLECTOMY  as child   Patient  Active Problem List   Diagnosis Date Noted   Malignant neoplasm of prostate (HCC) 05/02/2014    PCP: Danella Penton, MD  REFERRING PROVIDER: Lyndle Herrlich, MD  REFERRING DIAG: L total knee replacement  THERAPY DIAG:  History of total knee replacement, left  Joint stiffness of knee, left  Muscle weakness  Gait difficulty  Rationale for Evaluation and Treatment: Rehabilitation  ONSET DATE: 01/20/23 (surgery date).    SUBJECTIVE:   SUBJECTIVE STATEMENT:   EVALUATION Pt. S/p L TKA on 01/20/23.  Pt. Known well to PT clinic.  Pt. States he is in a lot of pain (8/10) and is having more pain in s/p L knee surgery than he did during R TKA.    PERTINENT HISTORY: See MD notes. PAIN:  Are you having pain? Yes: NPRS scale: 8/10 Pain location: L knee Pain description: burning Aggravating factors: knee flexion/ walking Relieving factors: ice/ medications  PRECAUTIONS: None  WEIGHT BEARING RESTRICTIONS: No  FALLS:  Has patient fallen in last 6 months? Yes. Number of falls 1 (no injury)  LIVING ENVIRONMENT: Lives with: lives with their spouse Lives in: House/apartment Has following equipment at home: Single point cane and Environmental consultant - 2 wheeled  OCCUPATION:  retired  PLOF: Independent  PATIENT GOALS: Increase L knee ROM/ strength and walking pain-free  NEXT MD VISIT: 02/01/23  OBJECTIVE:   PATIENT SURVEYS:  FOTO initial 27/ goal 63  COGNITION: Overall cognitive status: Within functional limits for tasks assessed     SENSATION: WFL  EDEMA:  Circumferential: L/R joint line (45.5/39 cm), 2" superior (47/40.5 cm), mid-gastroc (36.4/34.5 cm).  L knee bandages in place.    MUSCLE LENGTH: Hamstrings: Right 55 deg; Left 50 deg  POSTURE: rounded shoulders  PALPATION: Significant tenderness around L knee/ inner thigh (bruising).  No calf tenderness.  LOWER EXTREMITY ROM:  Active ROM Right eval Left eval  Hip flexion [facility]  Hip extension    Hip abduction    Hip  adduction    Hip internal rotation    Hip external rotation    Knee flexion 119 deg. 82 deg.  Knee extension -2 deg. -4 deg.  Ankle dorsiflexion    Ankle plantarflexion    Ankle inversion    Ankle eversion     (Blank rows = not tested)  L knee AA/PROM: 94 deg.  LOWER EXTREMITY MMT:  MMT Right eval Left eval  Hip flexion 4/5 4/5  Hip extension    Hip abduction 4/5 4/5  Hip adduction    Hip internal rotation    Hip external rotation    Knee flexion 5/5 4/5  Knee extension 5/5 3/5  Ankle dorsiflexion    Ankle plantarflexion    Ankle inversion    Ankle eversion     (Blank rows = not tested)  LOWER EXTREMITY SPECIAL TESTS:  N/A  FUNCTIONAL TESTS:  5 times sit to stand: TBD  GAIT: Distance walked: in clinic Assistive device utilized: Walker - 2 wheeled Level of assistance: SBA Comments: L antalgic gait with limited L hip/knee flexion during swing through phase of gait.   TODAY'S TREATMENT:                                                                                                                              DATE: 03/29/2023   Subjective:  Pt. Had increase knee pain yesterday and states Tramadol/ Tylenol is not helping with pain.  Pt. Did not sleep well last night and states he has to get out of bed to walk around and get into recliner to get knee comfortable.       There.ex.:    Nustep L5 10+ min. (Seat 8-7) with B UE/LE.   Walking in gym with consistent step pattern/ heel strike/ cadence without SPC.  Step ups/down at stairs with light UE assist on handrails 10x.     Seated marching/ LAQ 20x each.    Supine L SLR/ heel slides with holds as tolerated 10x2.    Discussed HEP  Manual tx.:   Supine L patellar mobs (all planes).  STM to L distal quad/hamstring.  Palpation over L lateral knee/ distal quad and hamstring.  Minimal to  no swelling noted.  Tenderness over L medial/ lateral knee joint.    Supine L knee AA/PROM flexion and extension (static  hamstring stretches) 5x each with holds.    Supine L knee AROM: -2 to 117 deg.  Ice to L knee after tx.     PATIENT EDUCATION:  Education details: [email or web address] Person educated: Patient Education method: Explanation, Demonstration, and Handouts Education comprehension: verbalized understanding and returned demonstration  HOME EXERCISE PROGRAM: Access Code: [email or web address] URL: [email or web address] Date: 01/27/2023 Prepared by: Dorene Grebe Exercises - Supine Quad Set - 2 x daily - 7 x weekly - 1 sets - 10 reps - Supine Heel Slide with Strap - 2 x daily - 7 x weekly - 1 sets - 10 reps - Seated Long Arc Quad - 2 x daily - 7 x weekly - 1 sets - 10 reps - Seated Knee Flexion Extension AAROM with Overpressure - 2 x daily - 7 x weekly - 1 sets - 10 reps - Small Range Straight Leg Raise - 2 x daily - 7 x weekly - 1 sets - 10 reps   ASSESSMENT:  CLINICAL IMPRESSION: Pt. Progressing well towards all PT goals and doing well with L knee ROM/ strength.  Pts. Primary issue is pain mgmt. At rest and during walking.  Pt. Is safe walking on level surfaces with no assistive device and benefits from [facility] with grassy/ uneven terrain.  No change to HEP and pt. Instructed to remain active with walking/ daily HEP.  Pt. Will benefit from skilled PT services to increase L knee ROM/ strength to improve pain-free mobility.    OBJECTIVE IMPAIRMENTS: Abnormal gait, decreased activity tolerance, decreased balance, decreased endurance, decreased mobility, difficulty walking, decreased ROM, decreased strength, hypomobility, impaired flexibility, improper body mechanics, and pain.   ACTIVITY LIMITATIONS: carrying, lifting, bending, standing, squatting, stairs, transfers, locomotion level, and caring for others  PARTICIPATION LIMITATIONS: community activity, yard work, and church  PERSONAL FACTORS: Age and Past/current experiences are also affecting patient's functional outcome.   REHAB POTENTIAL: Good  CLINICAL  DECISION MAKING: Evolving/moderate complexity  EVALUATION COMPLEXITY: Moderate   GOALS: Goals reviewed with patient? Yes  SHORT TERM GOALS: Target date: 04/19/23 Pt. Independent with HEP to increase L knee AROM (0 to >115 deg.) to improve functional mobility.   Baseline:  see above Goal status: Partially met  LONG TERM GOALS: Target date: 05/10/23  Pt. Will increase FOTO to 61 to improve pain-free mobility.  Baseline: initial 27.  5/28: 61 Goal status: Goal met  2.  Pt. Able to ambulate community distance with more normalized gait pattern and on assistive device safely.   Baseline:  L antalgic gait with RW.  5/9: ambulates without SPC with limited hip/knee flexion Goal status: Partially met  3.  Pt. Able to manage wife's w/c into and out of trunk of car to be able to attend church/ events.   Baseline: unable to lift w/c at this time.  5/9: putting w/c in/out of trunk of car.  Goal status: Goal met.  4.  Pt. Able to ascend/ descend 10 stairs with no UE assist on handrails safely to allow pt. To get into beach house.    Baseline: requires UE assist  Goal status: Initial  PLAN:  PT FREQUENCY: 2x/week  PT DURATION: 6 weeks  PLANNED INTERVENTIONS: Therapeutic exercises, Therapeutic activity, Neuromuscular re-education, Balance training, Gait training, Patient/Family education, Self Care, Joint mobilization, Electrical stimulation, Cryotherapy, Moist heat, scar mobilization, Manual therapy, and Re-evaluation  PLAN FOR NEXT SESSION:  Progress L knee ROM/ stability.    Cammie Mcgee, PT, DPT # [phone] 03/29/2023, 12:07 PM

## 2023-03-31 ENCOUNTER — Ambulatory Visit: Payer: Medicare HMO | Admitting: Physical Therapy

## 2023-03-31 DIAGNOSIS — Z96652 Presence of left artificial knee joint: Secondary | ICD-10-CM | POA: Diagnosis not present

## 2023-03-31 DIAGNOSIS — M6281 Muscle weakness (generalized): Secondary | ICD-10-CM

## 2023-03-31 DIAGNOSIS — M25662 Stiffness of left knee, not elsewhere classified: Secondary | ICD-10-CM

## 2023-03-31 DIAGNOSIS — R269 Unspecified abnormalities of gait and mobility: Secondary | ICD-10-CM

## 2023-04-03 ENCOUNTER — Encounter: Payer: Self-pay | Admitting: Physical Therapy

## 2023-04-03 NOTE — Therapy (Signed)
OUTPATIENT PHYSICAL THERAPY LOWER EXTREMITY TREATMENT  Patient Name: Melvin Johnson MRN: [id number] DOB:1940-12-03, 82 y.o., male Today's Date: 03/31/23  END OF SESSION:  PT End of Session - 04/03/23 1332     Visit Number 16    Number of Visits 26    Date for PT Re-Evaluation 05/10/23    PT Start Time 1023    PT Stop Time 1122    PT Time Calculation (min) 59 min             Past Medical History:  Diagnosis Date   Arthritis    At risk for sleep apnea    STOP-BANG=4        SENT TO PCP 06-26-2014   Coronary artery disease    no cardiologist for several years , sees pcp  Melvin Melvin Johnson   History of MI (myocardial infarction)    may 1999   Hyperlipidemia    Hypertension    Mild acid reflux    Neuropathy of both feet    burning sensation   Nocturia    Prostate cancer (HCC) 03/2014   Gleason 3+3=6, seed implant 07/03/14   S/P CABG x 4    Seasonal allergies    Type 2 diabetes mellitus (HCC)    Wears dentures    UPPER   Wears glasses    Past Surgical History:  Procedure Laterality Date   CARDIAC CATHETERIZATION  1999  ([place])   CATARACT EXTRACTION W/PHACO Right 03/02/2021   Procedure: CATARACT EXTRACTION PHACO AND INTRAOCULAR LENS PLACEMENT (IOC) RIGHT DIABETIC 5.79 00:45.2;  Surgeon: Melvin Crane, MD;  Location: [facility];  Service: Ophthalmology;  Laterality: Right;   CATARACT EXTRACTION W/PHACO Left 03/16/2021   Procedure: CATARACT EXTRACTION PHACO AND INTRAOCULAR LENS PLACEMENT (IOC) LEFT DIABETIC 5.90 00:41.5;  Surgeon: Melvin Crane, MD;  Location: [facility];  Service: Ophthalmology;  Laterality: Left;   CORONARY ARTERY BYPASS GRAFT  2003   Melvin Johnson   4 VESSEL   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   RADIOACTIVE SEED IMPLANT N/A 07/03/2014   Procedure: RADIOACTIVE SEED IMPLANT;  Surgeon: Melvin Fuller, MD;  Location: [facility];  Service: Urology;  Laterality: N/A;   TONSILLECTOMY  as child   Patient  Active Problem List   Diagnosis Date Noted   Malignant neoplasm of prostate (HCC) 05/02/2014    PCP: Melvin Penton, MD  REFERRING PROVIDER: Lyndle Herrlich, MD  REFERRING DIAG: L total knee replacement  THERAPY DIAG:  History of total knee replacement, left  Joint stiffness of knee, left  Muscle weakness  Gait difficulty  Rationale for Evaluation and Treatment: Rehabilitation  ONSET DATE: 01/20/23 (surgery date).    SUBJECTIVE:   SUBJECTIVE STATEMENT:   EVALUATION Pt. S/p L TKA on 01/20/23.  Pt. Known well to PT clinic.  Pt. States he is in a lot of pain (8/10) and is having more pain in s/p L knee surgery than he did during R TKA.    PERTINENT HISTORY: See MD notes. PAIN:  Are you having pain? Yes: NPRS scale: 8/10 Pain location: L knee Pain description: burning Aggravating factors: knee flexion/ walking Relieving factors: ice/ medications  PRECAUTIONS: None  WEIGHT BEARING RESTRICTIONS: No  FALLS:  Has patient fallen in last 6 months? Yes. Number of falls 1 (no injury)  LIVING ENVIRONMENT: Lives with: lives with their spouse Lives in: House/apartment Has following equipment at home: Single point cane and Environmental consultant - 2 wheeled  OCCUPATION:  retired  PLOF: Independent  PATIENT GOALS: Increase L knee ROM/ strength and walking pain-free  NEXT MD VISIT: 02/01/23  OBJECTIVE:   PATIENT SURVEYS:  FOTO initial 27/ goal 69  COGNITION: Overall cognitive status: Within functional limits for tasks assessed     SENSATION: WFL  EDEMA:  Circumferential: L/R joint line (45.5/39 cm), 2" superior (47/40.5 cm), mid-gastroc (36.4/34.5 cm).  L knee bandages in place.    MUSCLE LENGTH: Hamstrings: Right 55 deg; Left 50 deg  POSTURE: rounded shoulders  PALPATION: Significant tenderness around L knee/ inner thigh (bruising).  No calf tenderness.  LOWER EXTREMITY ROM:  Active ROM Right eval Left eval  Hip flexion [facility]  Hip extension    Hip abduction    Hip  adduction    Hip internal rotation    Hip external rotation    Knee flexion 119 deg. 82 deg.  Knee extension -2 deg. -4 deg.  Ankle dorsiflexion    Ankle plantarflexion    Ankle inversion    Ankle eversion     (Blank rows = not tested)  L knee AA/PROM: 94 deg.  LOWER EXTREMITY MMT:  MMT Right eval Left eval  Hip flexion 4/5 4/5  Hip extension    Hip abduction 4/5 4/5  Hip adduction    Hip internal rotation    Hip external rotation    Knee flexion 5/5 4/5  Knee extension 5/5 3/5  Ankle dorsiflexion    Ankle plantarflexion    Ankle inversion    Ankle eversion     (Blank rows = not tested)  LOWER EXTREMITY SPECIAL TESTS:  N/A  FUNCTIONAL TESTS:  5 times sit to stand: TBD  GAIT: Distance walked: in clinic Assistive device utilized: Walker - 2 wheeled Level of assistance: SBA Comments: L antalgic gait with limited L hip/knee flexion during swing through phase of gait.   TODAY'S TREATMENT:                                                                                                                              DATE: 03/31/23   Subjective:  Pt. Stopped Tramadol/Tylenol and MD prescribed Meloxicam for inflammation.  Pt. Reported improvement in knee pain.         There.ex.:    Nustep L5 10+ min. (Seat 8-7) with B UE/LE.   Walking in gym with consistent step pattern/ heel strike/ cadence without SPC.  Step ups/down at stairs with light UE assist on handrails 10x.     Seated marching/ LAQ 20x each.    Supine L SLR/ heel slides with holds as tolerated 10x2.    Standing 4# ankle wts: marching/ hip abduction/ knee flexion/ heel raises 20x.  Walking around PT clinic with ankle wts. And consistent step pattern/ hip flexion.    Manual tx.:   Supine L patellar mobs (all planes).  STM to L distal quad/hamstring.  Palpation over L lateral knee/ distal quad and hamstring.  Supine L knee AA/PROM flexion and extension (static hamstring stretches) 5x each with holds.     Supine L knee AROM: -2 to 117 deg.  Ice to L knee after tx.     PATIENT EDUCATION:  Education details: [email or web address] Person educated: Patient Education method: Explanation, Demonstration, and Handouts Education comprehension: verbalized understanding and returned demonstration  HOME EXERCISE PROGRAM: Access Code: [email or web address] URL: [email or web address] Date: 01/27/2023 Prepared by: Dorene Grebe Exercises - Supine Quad Set - 2 x daily - 7 x weekly - 1 sets - 10 reps - Supine Heel Slide with Strap - 2 x daily - 7 x weekly - 1 sets - 10 reps - Seated Long Arc Quad - 2 x daily - 7 x weekly - 1 sets - 10 reps - Seated Knee Flexion Extension AAROM with Overpressure - 2 x daily - 7 x weekly - 1 sets - 10 reps - Small Range Straight Leg Raise - 2 x daily - 7 x weekly - 1 sets - 10 reps   ASSESSMENT:  CLINICAL IMPRESSION: Pt. Progressing well towards all PT goals and doing well with L knee ROM/ strength.  Pts. Primary issue is pain mgmt. At rest and during walking.  Pt. Is safe walking on level surfaces with no assistive device and benefits from [facility] with grassy/ uneven terrain.  No change to HEP and pt. Instructed to remain active with walking/ daily HEP.  Pt. Will benefit from skilled PT services to increase L knee ROM/ strength to improve pain-free mobility.    OBJECTIVE IMPAIRMENTS: Abnormal gait, decreased activity tolerance, decreased balance, decreased endurance, decreased mobility, difficulty walking, decreased ROM, decreased strength, hypomobility, impaired flexibility, improper body mechanics, and pain.   ACTIVITY LIMITATIONS: carrying, lifting, bending, standing, squatting, stairs, transfers, locomotion level, and caring for others  PARTICIPATION LIMITATIONS: community activity, yard work, and church  PERSONAL FACTORS: Age and Past/current experiences are also affecting patient's functional outcome.   REHAB POTENTIAL: Good  CLINICAL DECISION MAKING: Evolving/moderate  complexity  EVALUATION COMPLEXITY: Moderate   GOALS: Goals reviewed with patient? Yes  SHORT TERM GOALS: Target date: 04/19/23 Pt. Independent with HEP to increase L knee AROM (0 to >115 deg.) to improve functional mobility.   Baseline:  see above Goal status: Partially met  LONG TERM GOALS: Target date: 05/10/23  Pt. Will increase FOTO to 61 to improve pain-free mobility.  Baseline: initial 27.  5/28: 61 Goal status: Goal met  2.  Pt. Able to ambulate community distance with more normalized gait pattern and on assistive device safely.   Baseline:  L antalgic gait with RW.  5/9: ambulates without SPC with limited hip/knee flexion Goal status: Partially met  3.  Pt. Able to manage wife's w/c into and out of trunk of car to be able to attend church/ events.   Baseline: unable to lift w/c at this time.  5/9: putting w/c in/out of trunk of car.  Goal status: Goal met.  4.  Pt. Able to ascend/ descend 10 stairs with no UE assist on handrails safely to allow pt. To get into beach house.    Baseline: requires UE assist  Goal status: Initial  PLAN:  PT FREQUENCY: 2x/week  PT DURATION: 6 weeks  PLANNED INTERVENTIONS: Therapeutic exercises, Therapeutic activity, Neuromuscular re-education, Balance training, Gait training, Patient/Family education, Self Care, Joint mobilization, Electrical stimulation, Cryotherapy, Moist heat, scar mobilization, Manual therapy, and Re-evaluation  PLAN FOR NEXT SESSION: Progress L knee ROM/ stability.    Cammie Mcgee, PT, DPT #  [id number] 04/03/2023, 1:33 PM

## 2023-04-05 ENCOUNTER — Ambulatory Visit: Payer: Medicare HMO | Admitting: Physical Therapy

## 2023-04-05 ENCOUNTER — Encounter: Payer: Self-pay | Admitting: Physical Therapy

## 2023-04-05 DIAGNOSIS — M25662 Stiffness of left knee, not elsewhere classified: Secondary | ICD-10-CM

## 2023-04-05 DIAGNOSIS — Z96652 Presence of left artificial knee joint: Secondary | ICD-10-CM | POA: Diagnosis not present

## 2023-04-05 DIAGNOSIS — R269 Unspecified abnormalities of gait and mobility: Secondary | ICD-10-CM

## 2023-04-05 DIAGNOSIS — M6281 Muscle weakness (generalized): Secondary | ICD-10-CM

## 2023-04-05 NOTE — Therapy (Signed)
OUTPATIENT PHYSICAL THERAPY LOWER EXTREMITY TREATMENT  Patient Name: Melvin Johnson MRN: [id number] DOB:05-22-1941, 82 y.o., male Today's Date: 04/05/2023  END OF SESSION:  PT End of Session - 04/05/23 1211     Visit Number 17    Number of Visits 26    Date for PT Re-Evaluation 05/10/23    PT Start Time 1037    PT Stop Time 1132    PT Time Calculation (min) 55 min             Past Medical History:  Diagnosis Date   Arthritis    At risk for sleep apnea    STOP-BANG=4        SENT TO PCP 06-26-2014   Coronary artery disease    no cardiologist for several years , sees pcp  dr mark Hyacinth Meeker   History of MI (myocardial infarction)    may 1999   Hyperlipidemia    Hypertension    Mild acid reflux    Neuropathy of both feet    burning sensation   Nocturia    Prostate cancer (HCC) 03/2014   Gleason 3+3=6, seed implant 07/03/14   S/P CABG x 4    Seasonal allergies    Type 2 diabetes mellitus (HCC)    Wears dentures    UPPER   Wears glasses    Past Surgical History:  Procedure Laterality Date   CARDIAC CATHETERIZATION  1999  ([place])   CATARACT EXTRACTION W/PHACO Right 03/02/2021   Procedure: CATARACT EXTRACTION PHACO AND INTRAOCULAR LENS PLACEMENT (IOC) RIGHT DIABETIC 5.79 00:45.2;  Surgeon: [place] Crane, MD;  Location: [facility];  Service: Ophthalmology;  Laterality: Right;   CATARACT EXTRACTION W/PHACO Left 03/16/2021   Procedure: CATARACT EXTRACTION PHACO AND INTRAOCULAR LENS PLACEMENT (IOC) LEFT DIABETIC 5.90 00:41.5;  Surgeon: Nevada Crane, MD;  Location: [facility];  Service: Ophthalmology;  Laterality: Left;   CORONARY ARTERY BYPASS GRAFT  2003   dr vantright   4 VESSEL   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   RADIOACTIVE SEED IMPLANT N/A 07/03/2014   Procedure: RADIOACTIVE SEED IMPLANT;  Surgeon: Valetta Fuller, MD;  Location: [facility];  Service: Urology;  Laterality: N/A;   TONSILLECTOMY  as child   Patient  Active Problem List   Diagnosis Date Noted   Malignant neoplasm of prostate (HCC) 05/02/2014   PCP: Danella Penton, MD  REFERRING PROVIDER: Lyndle Herrlich, MD  REFERRING DIAG: L total knee replacement  THERAPY DIAG:  History of total knee replacement, left  Joint stiffness of knee, left  Muscle weakness  Gait difficulty  Rationale for Evaluation and Treatment: Rehabilitation  ONSET DATE: 01/20/23 (surgery date).    SUBJECTIVE:   SUBJECTIVE STATEMENT:   EVALUATION Pt. S/p L TKA on 01/20/23.  Pt. Known well to PT clinic.  Pt. States he is in a lot of pain (8/10) and is having more pain in s/p L knee surgery than he did during R TKA.    PERTINENT HISTORY: See MD notes. PAIN:  Are you having pain? Yes: NPRS scale: 8/10 Pain location: L knee Pain description: burning Aggravating factors: knee flexion/ walking Relieving factors: ice/ medications  PRECAUTIONS: None  WEIGHT BEARING RESTRICTIONS: No  FALLS:  Has patient fallen in last 6 months? Yes. Number of falls 1 (no injury)  LIVING ENVIRONMENT: Lives with: lives with their spouse Lives in: House/apartment Has following equipment at home: Single point cane and Environmental consultant - 2 wheeled  OCCUPATION: retired  PLOF: Independent  PATIENT GOALS: Increase L knee ROM/ strength and walking pain-free  NEXT MD VISIT: 02/01/23  OBJECTIVE:   PATIENT SURVEYS:  FOTO initial 27/ goal 83  COGNITION: Overall cognitive status: Within functional limits for tasks assessed     SENSATION: WFL  EDEMA:  Circumferential: L/R joint line (45.5/39 cm), 2" superior (47/40.5 cm), mid-gastroc (36.4/34.5 cm).  L knee bandages in place.    MUSCLE LENGTH: Hamstrings: Right 55 deg; Left 50 deg  POSTURE: rounded shoulders  PALPATION: Significant tenderness around L knee/ inner thigh (bruising).  No calf tenderness.  LOWER EXTREMITY ROM:  Active ROM Right eval Left eval  Hip flexion [facility]  Hip extension    Hip abduction    Hip  adduction    Hip internal rotation    Hip external rotation    Knee flexion 119 deg. 82 deg.  Knee extension -2 deg. -4 deg.  Ankle dorsiflexion    Ankle plantarflexion    Ankle inversion    Ankle eversion     (Blank rows = not tested)  L knee AA/PROM: 94 deg.  LOWER EXTREMITY MMT:  MMT Right eval Left eval  Hip flexion 4/5 4/5  Hip extension    Hip abduction 4/5 4/5  Hip adduction    Hip internal rotation    Hip external rotation    Knee flexion 5/5 4/5  Knee extension 5/5 3/5  Ankle dorsiflexion    Ankle plantarflexion    Ankle inversion    Ankle eversion     (Blank rows = not tested)  LOWER EXTREMITY SPECIAL TESTS:  N/A  FUNCTIONAL TESTS:  5 times sit to stand: TBD  GAIT: Distance walked: in clinic Assistive device utilized: Walker - 2 wheeled Level of assistance: SBA Comments: L antalgic gait with limited L hip/knee flexion during swing through phase of gait.   TODAY'S TREATMENT:                                                                                                                              DATE: 04/05/2023   Subjective:  Pt. Continues to benefit from use of Meloxicam for inflammation.  Pt. Reports continued improvement but will still have c/o nerve pain in L knee joint.         There.ex.:    Nustep L5 10+ min. (Seat 8-7) with B UE/LE.  Discussed weekend activities.  Walking in gym with consistent step pattern/ heel strike/ cadence without SPC.  3# ankle wts.: walking in //-bars forward/ backwards/ lateral with good upright posture/ step pattern 5 laps each.  Standing hamstring curls/ heel raises/ sit to stands 20x each.  Seated LAQ 20x.  Good L knee ROM/ extension during quad activation.       Supine L knee to chest/ hamstring stretches in supine 3x each with holds.   Supine L knee AROM: -2 to 117 deg.  Discussed golf swing/ pt. Will bring golf club to next  PT visit.     PATIENT EDUCATION:  Education details: [email or web address] Person educated:  Patient Education method: Explanation, Demonstration, and Handouts Education comprehension: verbalized understanding and returned demonstration  HOME EXERCISE PROGRAM: Access Code: [email or web address] URL: [email or web address] Date: 01/27/2023 Prepared by: Dorene Grebe Exercises - Supine Quad Set - 2 x daily - 7 x weekly - 1 sets - 10 reps - Supine Heel Slide with Strap - 2 x daily - 7 x weekly - 1 sets - 10 reps - Seated Long Arc Quad - 2 x daily - 7 x weekly - 1 sets - 10 reps - Seated Knee Flexion Extension AAROM with Overpressure - 2 x daily - 7 x weekly - 1 sets - 10 reps - Small Range Straight Leg Raise - 2 x daily - 7 x weekly - 1 sets - 10 reps   ASSESSMENT:  CLINICAL IMPRESSION: Pt. Progressing well towards all PT goals and doing well with L knee ROM/ strength.  Pts. Primary issue is pain mgmt. Which has really improved since starting Meloxicam.  Pt. Is safe walking on level surfaces with no assistive device and benefits from [facility] with grassy/ uneven terrain.  No change to HEP and pt. Instructed to remain active with walking/ daily HEP.  Pt. Will benefit from skilled PT services to increase L knee ROM/ strength to improve pain-free mobility.    OBJECTIVE IMPAIRMENTS: Abnormal gait, decreased activity tolerance, decreased balance, decreased endurance, decreased mobility, difficulty walking, decreased ROM, decreased strength, hypomobility, impaired flexibility, improper body mechanics, and pain.   ACTIVITY LIMITATIONS: carrying, lifting, bending, standing, squatting, stairs, transfers, locomotion level, and caring for others  PARTICIPATION LIMITATIONS: community activity, yard work, and church  PERSONAL FACTORS: Age and Past/current experiences are also affecting patient's functional outcome.   REHAB POTENTIAL: Good  CLINICAL DECISION MAKING: Evolving/moderate complexity  EVALUATION COMPLEXITY: Moderate   GOALS: Goals reviewed with patient? Yes  SHORT TERM GOALS: Target date:  04/19/23 Pt. Independent with HEP to increase L knee AROM (0 to >115 deg.) to improve functional mobility.   Baseline:  see above Goal status: Partially met  LONG TERM GOALS: Target date: 05/10/23  Pt. Will increase FOTO to 61 to improve pain-free mobility.  Baseline: initial 27.  5/28: 61 Goal status: Goal met  2.  Pt. Able to ambulate community distance with more normalized gait pattern and on assistive device safely.   Baseline:  L antalgic gait with RW.  5/9: ambulates without SPC with limited hip/knee flexion Goal status: Partially met  3.  Pt. Able to manage wife's w/c into and out of trunk of car to be able to attend church/ events.   Baseline: unable to lift w/c at this time.  5/9: putting w/c in/out of trunk of car.  Goal status: Goal met.  4.  Pt. Able to ascend/ descend 10 stairs with no UE assist on handrails safely to allow pt. To get into beach house.    Baseline: requires UE assist  Goal status: Initial  PLAN:  PT FREQUENCY: 2x/week  PT DURATION: 6 weeks  PLANNED INTERVENTIONS: Therapeutic exercises, Therapeutic activity, Neuromuscular re-education, Balance training, Gait training, Patient/Family education, Self Care, Joint mobilization, Electrical stimulation, Cryotherapy, Moist heat, scar mobilization, Manual therapy, and Re-evaluation  PLAN FOR NEXT SESSION: Probable discharge next tx. Session/ check goals.    Cammie Mcgee, PT, DPT # [phone] 04/05/2023, 12:12 PM

## 2023-04-07 ENCOUNTER — Encounter: Payer: Self-pay | Admitting: Physical Therapy

## 2023-04-07 ENCOUNTER — Ambulatory Visit: Payer: Medicare HMO | Admitting: Physical Therapy

## 2023-04-07 DIAGNOSIS — Z96652 Presence of left artificial knee joint: Secondary | ICD-10-CM

## 2023-04-07 DIAGNOSIS — R269 Unspecified abnormalities of gait and mobility: Secondary | ICD-10-CM

## 2023-04-07 DIAGNOSIS — M25662 Stiffness of left knee, not elsewhere classified: Secondary | ICD-10-CM

## 2023-04-07 DIAGNOSIS — M6281 Muscle weakness (generalized): Secondary | ICD-10-CM

## 2023-04-07 NOTE — Therapy (Signed)
OUTPATIENT PHYSICAL THERAPY LOWER EXTREMITY TREATMENT/ DISCHARGE  Patient Name: Melvin Johnson MRN: [id number] DOB:13-Feb-1941, 82 y.o., male Today's Date: 04/07/2023  END OF SESSION:  PT End of Session - 04/07/23 2008     Visit Number 18    Number of Visits 26    Date for PT Re-Evaluation 05/10/23    PT Start Time 1017    PT Stop Time 1111    PT Time Calculation (min) 54 min             Past Medical History:  Diagnosis Date   Arthritis    At risk for sleep apnea    STOP-BANG=4        SENT TO PCP 06-26-2014   Coronary artery disease    no cardiologist for several years , sees pcp  dr mark Hyacinth Meeker   History of MI (myocardial infarction)    may 1999   Hyperlipidemia    Hypertension    Mild acid reflux    Neuropathy of both feet    burning sensation   Nocturia    Prostate cancer (HCC) 03/2014   Gleason 3+3=6, seed implant 07/03/14   S/P CABG x 4    Seasonal allergies    Type 2 diabetes mellitus (HCC)    Wears dentures    UPPER   Wears glasses    Past Surgical History:  Procedure Laterality Date   CARDIAC CATHETERIZATION  1999  ([place])   CATARACT EXTRACTION W/PHACO Right 03/02/2021   Procedure: CATARACT EXTRACTION PHACO AND INTRAOCULAR LENS PLACEMENT (IOC) RIGHT DIABETIC 5.79 00:45.2;  Surgeon: Nevada Crane, MD;  Location: [facility];  Service: Ophthalmology;  Laterality: Right;   CATARACT EXTRACTION W/PHACO Left 03/16/2021   Procedure: CATARACT EXTRACTION PHACO AND INTRAOCULAR LENS PLACEMENT (IOC) LEFT DIABETIC 5.90 00:41.5;  Surgeon: Nevada Crane, MD;  [facility];  Service: Ophthalmology;  Laterality: Left;   CORONARY ARTERY BYPASS GRAFT  2003   dr vantright   4 VESSEL   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   RADIOACTIVE SEED IMPLANT N/A 07/03/2014   Procedure: RADIOACTIVE SEED IMPLANT;  Surgeon: Valetta Fuller, MD;  Location: [facility];  Service: Urology;  Laterality: N/A;   TONSILLECTOMY  as child    Patient Active Problem List   Diagnosis Date Noted   Malignant neoplasm of prostate (HCC) 05/02/2014   PCP: Danella Penton, MD  REFERRING PROVIDER: Lyndle Herrlich, MD  REFERRING DIAG: L total knee replacement  THERAPY DIAG:  History of total knee replacement, left  Joint stiffness of knee, left  Muscle weakness  Gait difficulty  Rationale for Evaluation and Treatment: Rehabilitation  ONSET DATE: 01/20/23 (surgery date).    SUBJECTIVE:   SUBJECTIVE STATEMENT:   EVALUATION Pt. S/p L TKA on 01/20/23.  Pt. Known well to PT clinic.  Pt. States he is in a lot of pain (8/10) and is having more pain in s/p L knee surgery than he did during R TKA.    PERTINENT HISTORY: See MD notes. PAIN:  Are you having pain? Yes: NPRS scale: 8/10 Pain location: L knee Pain description: burning Aggravating factors: knee flexion/ walking Relieving factors: ice/ medications  PRECAUTIONS: None  WEIGHT BEARING RESTRICTIONS: No  FALLS:  Has patient fallen in last 6 months? Yes. Number of falls 1 (no injury)  LIVING ENVIRONMENT: Lives with: lives with their spouse Lives in: House/apartment Has following equipment at home: Single point cane and Environmental consultant - 2 wheeled  OCCUPATION:  retired  PLOF: Independent  PATIENT GOALS: Increase L knee ROM/ strength and walking pain-free  NEXT MD VISIT: 02/01/23  OBJECTIVE:   PATIENT SURVEYS:  FOTO initial 27/ goal 5  COGNITION: Overall cognitive status: Within functional limits for tasks assessed     SENSATION: WFL  EDEMA:  Circumferential: L/R joint line (45.5/39 cm), 2" superior (47/40.5 cm), mid-gastroc (36.4/34.5 cm).  L knee bandages in place.    MUSCLE LENGTH: Hamstrings: Right 55 deg; Left 50 deg  POSTURE: rounded shoulders  PALPATION: Significant tenderness around L knee/ inner thigh (bruising).  No calf tenderness.  LOWER EXTREMITY ROM:  Active ROM Right eval Left eval  Hip flexion [facility]  Hip extension    Hip  abduction    Hip adduction    Hip internal rotation    Hip external rotation    Knee flexion 119 deg. 82 deg.  Knee extension -2 deg. -4 deg.  Ankle dorsiflexion    Ankle plantarflexion    Ankle inversion    Ankle eversion     (Blank rows = not tested)  L knee AA/PROM: 94 deg.  LOWER EXTREMITY MMT:  MMT Right eval Left eval  Hip flexion 4/5 4/5  Hip extension    Hip abduction 4/5 4/5  Hip adduction    Hip internal rotation    Hip external rotation    Knee flexion 5/5 4/5  Knee extension 5/5 3/5  Ankle dorsiflexion    Ankle plantarflexion    Ankle inversion    Ankle eversion     (Blank rows = not tested)  LOWER EXTREMITY SPECIAL TESTS:  N/A  FUNCTIONAL TESTS:  5 times sit to stand: TBD  GAIT: Distance walked: in clinic Assistive device utilized: Walker - 2 wheeled Level of assistance: SBA Comments: L antalgic gait with limited L hip/knee flexion during swing through phase of gait.   TODAY'S TREATMENT:                                                                                                                              DATE: 04/07/2023   Subjective:  Pt. Entered PT with no c/o pain and carrying SPC.  Pt. Continues to benefit from use of Meloxicam for inflammation.  Pt. Reports continued improvement with everyday functional tasks.        There.ex.:    Nustep L5 10+ min. (Seat 8-7) with B UE/LE.  Discussed weekend activities.  Walking in gym with consistent step pattern/ heel strike/ cadence without SPC.  4# ankle wts.: Standing hamstring curls/ heel raises/ marching/ hip abduction 20x each.  Seated LAQ 20x.  Good L knee ROM/ extension during quad activation.       Supine L knee AROM: -2 to 117 deg.  Assessed golf swing outside with good LE control/ trunk rotn. (No LOB).    Goal reassessment.  Reviewed HEP in depth.    PATIENT EDUCATION:  Education details: [email or web address] Person educated: Patient Education method:  Explanation, Demonstration, and  Handouts Education comprehension: verbalized understanding and returned demonstration  HOME EXERCISE PROGRAM: Access Code: [email or web address] URL: [email or web address] Date: 01/27/2023 Prepared by: Dorene Grebe Exercises - Supine Quad Set - 2 x daily - 7 x weekly - 1 sets - 10 reps - Supine Heel Slide with Strap - 2 x daily - 7 x weekly - 1 sets - 10 reps - Seated Long Arc Quad - 2 x daily - 7 x weekly - 1 sets - 10 reps - Seated Knee Flexion Extension AAROM with Overpressure - 2 x daily - 7 x weekly - 1 sets - 10 reps - Small Range Straight Leg Raise - 2 x daily - 7 x weekly - 1 sets - 10 reps   ASSESSMENT:  CLINICAL IMPRESSION: Pt. Progressing well towards all PT goals and doing well with L knee ROM/ strength.  Pts. Pain has really improved since starting Meloxicam.  Pt. Is safe walking on level surfaces with no assistive device and benefits from [facility] with grassy/ uneven terrain.  Pt. Will continue with daily walking/ HEP on an independent basis at this time.  Discharge from skilled PT services.    OBJECTIVE IMPAIRMENTS: Abnormal gait, decreased activity tolerance, decreased balance, decreased endurance, decreased mobility, difficulty walking, decreased ROM, decreased strength, hypomobility, impaired flexibility, improper body mechanics, and pain.   ACTIVITY LIMITATIONS: carrying, lifting, bending, standing, squatting, stairs, transfers, locomotion level, and caring for others  PARTICIPATION LIMITATIONS: community activity, yard work, and church  PERSONAL FACTORS: Age and Past/current experiences are also affecting patient's functional outcome.   REHAB POTENTIAL: Good  CLINICAL DECISION MAKING: Evolving/moderate complexity  EVALUATION COMPLEXITY: Moderate   GOALS: Goals reviewed with patient? Yes  SHORT TERM GOALS: Target date: 04/19/23 Pt. Independent with HEP to increase L knee AROM (0 to >115 deg.) to improve functional mobility.   Baseline:  see above Goal status: Goal  met  LONG TERM GOALS: Target date: 05/10/23  Pt. Will increase FOTO to 61 to improve pain-free mobility.  Baseline: initial 27.  5/28: 61.  6/13: 69 Goal status: Goal met  2.  Pt. Able to ambulate community distance with more normalized gait pattern and on assistive device safely.   Baseline:  L antalgic gait with RW.  5/9: ambulates without SPC with limited hip/knee flexion Goal status: Goal met  3.  Pt. Able to manage wife's w/c into and out of trunk of car to be able to attend church/ events.   Baseline: unable to lift w/c at this time.  5/9: putting w/c in/out of trunk of car.  Goal status: Goal met.  4.  Pt. Able to ascend/ descend 10 stairs with no UE assist on handrails safely to allow pt. To get into beach house.    Baseline: requires UE assist  Goal status: Goal met  PLAN:  PT FREQUENCY: 2x/week  PT DURATION: 6 weeks  PLANNED INTERVENTIONS: Therapeutic exercises, Therapeutic activity, Neuromuscular re-education, Balance training, Gait training, Patient/Family education, Self Care, Joint mobilization, Electrical stimulation, Cryotherapy, Moist heat, scar mobilization, Manual therapy, and Re-evaluation  PLAN FOR NEXT SESSION: Discharge to HEP at this time  Cammie Mcgee, PT, DPT # [id number] 04/07/2023, 8:09 PM

## 2023-04-12 ENCOUNTER — Encounter: Payer: Medicare HMO | Admitting: Physical Therapy

## 2023-04-14 ENCOUNTER — Encounter: Payer: Medicare HMO | Admitting: Physical Therapy

## 2023-04-19 ENCOUNTER — Encounter: Payer: Medicare HMO | Admitting: Physical Therapy

## 2023-04-21 ENCOUNTER — Encounter: Payer: Medicare HMO | Admitting: Physical Therapy

## 2023-04-26 ENCOUNTER — Encounter: Payer: Medicare HMO | Admitting: Physical Therapy

## 2023-05-03 ENCOUNTER — Encounter: Payer: Medicare HMO | Admitting: Physical Therapy

## 2023-05-05 ENCOUNTER — Encounter: Payer: Medicare HMO | Admitting: Physical Therapy

## 2023-05-10 ENCOUNTER — Other Ambulatory Visit: Payer: Self-pay | Admitting: Orthopedic Surgery

## 2023-05-10 ENCOUNTER — Ambulatory Visit
Admission: RE | Admit: 2023-05-10 | Discharge: 2023-05-10 | Disposition: A | Discharge status: Home / Self Care | Payer: Medicare HMO | Source: Ambulatory Visit | Attending: Orthopedic Surgery | Admitting: Orthopedic Surgery

## 2023-05-10 DIAGNOSIS — R2242 Localized swelling, mass and lump, left lower limb: Secondary | ICD-10-CM

## 2023-05-11 ENCOUNTER — Other Ambulatory Visit
Admission: RE | Admit: 2023-05-11 | Discharge: 2023-05-11 | Disposition: A | Discharge status: Home / Self Care | Payer: Medicare HMO | Source: Ambulatory Visit | Attending: Orthopedic Surgery | Admitting: Orthopedic Surgery

## 2023-05-11 DIAGNOSIS — R2242 Localized swelling, mass and lump, left lower limb: Secondary | ICD-10-CM | POA: Insufficient documentation

## 2023-05-11 DIAGNOSIS — Z96642 Presence of left artificial hip joint: Secondary | ICD-10-CM | POA: Insufficient documentation

## 2023-05-11 LAB — CBC WITH DIFFERENTIAL/PLATELET
Abs Immature Granulocytes: 0.01 10*3/uL (ref 0.00–0.07)
Basophils Absolute: 0 10*3/uL (ref 0.0–0.1)
Basophils Relative: 1 %
Eosinophils Absolute: 0.2 10*3/uL (ref 0.0–0.5)
Eosinophils Relative: 3 %
HCT: 36.2 % — ABNORMAL LOW (ref 39.0–52.0)
Hemoglobin: 12 g/dL — ABNORMAL LOW (ref 13.0–17.0)
Immature Granulocytes: 0 %
Lymphocytes Relative: 35 %
Lymphs Abs: 2.2 10*3/uL (ref 0.7–4.0)
MCH: 30.6 pg (ref 26.0–34.0)
MCHC: 33.1 g/dL (ref 30.0–36.0)
MCV: 92.3 fL (ref 80.0–100.0)
Monocytes Absolute: 0.5 10*3/uL (ref 0.1–1.0)
Monocytes Relative: 9 %
Neutro Abs: 3.2 10*3/uL (ref 1.7–7.7)
Neutrophils Relative %: 52 %
Platelets: 201 10*3/uL (ref 150–400)
RBC: 3.92 MIL/uL — ABNORMAL LOW (ref 4.22–5.81)
RDW: 14 % (ref 11.5–15.5)
WBC: 6.1 10*3/uL (ref 4.0–10.5)
nRBC: 0 % (ref 0.0–0.2)

## 2023-05-11 LAB — C-REACTIVE PROTEIN: CRP: 1.3 mg/dL — ABNORMAL HIGH (ref <1.0)

## 2023-05-11 LAB — SEDIMENTATION RATE: Sed Rate: 22 mm/hr — ABNORMAL HIGH (ref 0–20)

## 2023-08-15 ENCOUNTER — Ambulatory Visit
Admission: RE | Admit: 2023-08-15 | Discharge: 2023-08-15 | Disposition: A | Discharge status: Home / Self Care | Payer: Medicare HMO | Source: Ambulatory Visit | Attending: Orthopedic Surgery | Admitting: Orthopedic Surgery

## 2023-08-15 ENCOUNTER — Other Ambulatory Visit: Payer: Self-pay | Admitting: Orthopedic Surgery

## 2023-08-15 DIAGNOSIS — Z96652 Presence of left artificial knee joint: Secondary | ICD-10-CM | POA: Insufficient documentation

## 2024-10-24 ENCOUNTER — Other Ambulatory Visit (HOSPITAL_COMMUNITY): Payer: Self-pay
